# Patient Record
Sex: Male | Born: 1995 | Race: Black or African American | Hispanic: No | Marital: Single | Smoking: Current every day smoker
Health system: Southern US, Community
[De-identification: ages and names within clinical notes are randomized; demographics above are authoritative.]

## PROBLEM LIST (undated history)

## (undated) DIAGNOSIS — K859 Acute pancreatitis without necrosis or infection, unspecified: Secondary | ICD-10-CM

## (undated) HISTORY — PX: SHOULDER SURGERY: SHX246

---
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
## 2013-05-03 ENCOUNTER — Emergency Department (INDEPENDENT_AMBULATORY_CARE_PROVIDER_SITE_OTHER)
Admission: EM | Admit: 2013-05-03 | Discharge: 2013-05-03 | Disposition: A | Discharge status: Home / Self Care | Payer: Self-pay | Source: Home / Self Care

## 2013-05-03 ENCOUNTER — Encounter (HOSPITAL_COMMUNITY): Payer: Self-pay | Admitting: Emergency Medicine

## 2013-05-03 DIAGNOSIS — S40862A Insect bite (nonvenomous) of left upper arm, initial encounter: Secondary | ICD-10-CM

## 2013-05-03 DIAGNOSIS — IMO0001 Reserved for inherently not codable concepts without codable children: Secondary | ICD-10-CM

## 2013-05-03 DIAGNOSIS — L089 Local infection of the skin and subcutaneous tissue, unspecified: Secondary | ICD-10-CM

## 2013-05-03 MED ORDER — CEPHALEXIN 500 MG PO CAPS: 500.0000 mg | ORAL_CAPSULE | Freq: Four times a day (QID) | ORAL | Status: DC

## 2013-05-03 MED ORDER — TRIAMCINOLONE ACETONIDE 0.1 % EX CREA: TOPICAL_CREAM | Freq: Two times a day (BID) | CUTANEOUS | Status: DC

## 2013-05-03 NOTE — ED Notes (Signed)
C/o insect bite on left arm and right leg for a week now.  neosporin used.  Bites does itch.  Pus has came out of the bite.

## 2013-05-03 NOTE — ED Provider Notes (Signed)
Medical screening examination/treatment/procedure(s) were performed by non-physician practitioner and as supervising physician I was immediately available for consultation/collaboration.  Wesson Stith, M.D.  Jenica Costilow C Zackory Pudlo, MD 05/03/13 1752 

## 2013-05-03 NOTE — ED Provider Notes (Signed)
CSN: [id number]     Arrival date & time 05/03/13  1449 History   First MD Initiated Contact with Patient 05/03/13 1528     Chief Complaint  Patient presents with  . Insect Bite   (Consider location/radiation/quality/duration/timing/severity/associated sxs/prior Treatment) HPI Comments: 17 year old male is accompanied by his father with the complaint of insect bites one week old. He is unsure as to what type of insect and/or spider may have bitten him. He complains of left arm itching as well as itching in the right shin. There is minor excoriation and wheals to the left elbow over the lateral epicondyles extending superiorly and inferiorly measuring approximately 8 x 8 cm. The lesion on the right shin is approximately 3 cm across there is evidence that there may have been a ruptured vesicle that now is flat and dry. Patient denies systemic symptoms. He states that the lesions have not worsened over the past 3 days. They are not tender or painful.   History reviewed. No pertinent past medical history. History reviewed. No pertinent past surgical history. History reviewed. No pertinent family history. History  Substance Use Topics  . Smoking status: Not on file  . Smokeless tobacco: Not on file  . Alcohol Use: Not on file    Review of Systems  Constitutional: Negative.   Respiratory: Negative.   Musculoskeletal: Negative.   Skin: Positive for rash.  Neurological: Negative.     Allergies  Review of patient's allergies indicates no known allergies.  Home Medications   Current Outpatient Rx  Name  Route  Sig  Dispense  Refill  . cephALEXin (KEFLEX) 500 MG capsule   Oral   Take 1 capsule (500 mg total) by mouth 4 (four) times daily.   28 capsule   0   . ibuprofen (ADVIL,MOTRIN) 100 MG tablet   Oral   Take 400 mg by mouth every 6 (six) hours as needed.         . triamcinolone cream (KENALOG) 0.1 %   Topical   Apply topically 2 (two) times daily. Apply for 2 weeks. May use  on face   30 g   0    BP 121/79  Pulse 50  Temp(Src) 98.2 F (36.8 C) (Oral)  Resp 18  SpO2 100% Physical Exam  Nursing note and vitals reviewed. Constitutional: He is oriented to person, place, and time. He appears well-nourished. No distress.  Appears generally well and relaxed posturing.  HENT:  Mouth/Throat: Oropharynx is clear and moist. No oropharyngeal exudate.  No intraoral or pharyngeal swelling.  Eyes: Conjunctivae and EOM are normal.  Neck: Normal range of motion. Neck supple.  Cardiovascular: Normal rate, regular rhythm and normal heart sounds.   Pulmonary/Chest: Effort normal and breath sounds normal. No respiratory distress.  Abdominal: Soft. There is no tenderness.  Musculoskeletal: Normal range of motion.  Neurological: He is alert and oriented to person, place, and time.  Skin: Skin is warm and dry.  Lesions as described in history of present illness. They appeared to be in a healing state and there are areas of nondraining excoriation. Minor swelling particularly to the right mid anterior shin. No erythema, this is a very dark skinned individual. Neuromuscular and sensory functions of extremities are normal.  Psychiatric: He has a normal mood and affect.    ED Course  Procedures (including critical care time) Labs Review Labs Reviewed - No data to display Imaging Review No results found.  MDM   1. Insect bite of arm, left, initial  encounter   2. Insect bite of leg, infected, right, initial encounter    There are areas of excoriation but without draining or oozing. This may set up a condition of secondary infection so he is treated with Keflex prophylactically. Triamcinolone cream applied topically. If there is any worsening or signs of infection or increased area of tissue destruction, ulceration, weeping, redness, swelling or other symptoms 6 medical attention promptly. May return to the ER.    Hayden Rasmussen, NP 05/03/13 [phone]

## 2015-08-31 ENCOUNTER — Encounter (HOSPITAL_COMMUNITY): Payer: Self-pay | Admitting: Emergency Medicine

## 2015-08-31 ENCOUNTER — Emergency Department (HOSPITAL_COMMUNITY)
Admission: EM | Admit: 2015-08-31 | Discharge: 2015-08-31 | Disposition: A | Discharge status: Home / Self Care | Payer: Self-pay | Source: Home / Self Care | Attending: Emergency Medicine | Admitting: Emergency Medicine

## 2015-08-31 DIAGNOSIS — K648 Other hemorrhoids: Secondary | ICD-10-CM

## 2015-08-31 DIAGNOSIS — K644 Residual hemorrhoidal skin tags: Secondary | ICD-10-CM

## 2015-08-31 DIAGNOSIS — K5900 Constipation, unspecified: Secondary | ICD-10-CM

## 2015-08-31 MED ORDER — HYDROCORTISONE 2.5 % RE CREA: TOPICAL_CREAM | RECTAL | Status: DC

## 2015-08-31 MED ORDER — POLYETHYLENE GLYCOL 3350 17 GM/SCOOP PO POWD: 17.0000 g | Freq: Two times a day (BID) | ORAL | Status: DC

## 2015-08-31 NOTE — ED Provider Notes (Signed)
CSN: [id number]     Arrival date & time 08/31/15  1406 History   First MD Initiated Contact with Patient 08/31/15 1539     Chief Complaint  Patient presents with  . Hemorrhoids   (Consider location/radiation/quality/duration/timing/severity/associated sxs/prior Treatment) HPI He is a 19 year old man here for evaluation of hemorrhoids. He states he has pain with bowel movements. He has noticed a swollen area on his penis. He does report bright red blood with bowel movements. He reports straining and hard bowel movements. He has a bowel movement every 2-3 days.  History reviewed. No pertinent past medical history. Past Surgical History  Procedure Laterality Date  . Shoulder surgery     No family history on file. Social History  Substance Use Topics  . Smoking status: Never Smoker   . Smokeless tobacco: None  . Alcohol Use: No    Review of Systems As in history of present illness Allergies  Review of patient's allergies indicates no known allergies.  Home Medications   Prior to Admission medications   Medication Sig Start Date End Date Taking? Authorizing Provider  hydrocortisone (ANUSOL-HC) 2.5 % rectal cream Apply rectally 2 times daily 08/31/15   Charm RingsErin J Ziere Docken, MD  ibuprofen (ADVIL,MOTRIN) 100 MG tablet Take 400 mg by mouth every 6 (six) hours as needed.    Historical Provider, MD  polyethylene glycol powder (GLYCOLAX/MIRALAX) powder Take 17 g by mouth 2 (two) times daily. 08/31/15   Charm RingsErin J Deacon Gadbois, MD  triamcinolone cream (KENALOG) 0.1 % Apply topically 2 (two) times daily. Apply for 2 weeks. May use on face 05/03/13   [name], NP   Meds Ordered and Administered this Visit  Medications - No data to display  BP 130/79 mmHg  Pulse 61  Temp(Src) 97.9 F (36.6 C) (Oral)  SpO2 98% No data found.   Physical Exam  Constitutional: He is oriented to person, place, and time. He appears well-developed and well-nourished. No distress.  Cardiovascular: Normal rate.    Pulmonary/Chest: Effort normal.  Genitourinary: Rectal exam shows external hemorrhoid. Rectal exam shows no internal hemorrhoid, no fissure, no mass, no tenderness and anal tone normal.  Neurological: He is alert and oriented to person, place, and time.    ED Course  Procedures (including critical care time)  Labs Review Labs Reviewed - No data to display  Imaging Review No results found.    MDM   1. External hemorrhoid   2. Constipation, unspecified constipation type    He has a nonthrombosed external hemorrhoid. Discussed importance of regular bowel movements without straining. Recommended increased fluid and fiber intake. Instructions given for MiraLAX. Prescription given for Anusol cream to use as needed for hemorrhoid pain. Follow-up as needed.    Charm RingsErin J Brealynn Contino, MD 08/31/15 [phone]

## 2015-08-31 NOTE — Discharge Instructions (Signed)
You have a hemorrhoid. The most important thing is to stop straining with bowel movements. Make sure you are drinking plenty of fluids and getting fiber in your diet. Take MiraLAX twice a day until you are having a daily soft bowel movement. Once you are having a daily soft bowel movement, you can use the MiraLAX as needed. Use the Anusol cream twice a day as needed for hemorrhoid pain. Follow-up with your primary care doctor as needed.

## 2015-10-03 ENCOUNTER — Emergency Department (HOSPITAL_COMMUNITY): Payer: PRIVATE HEALTH INSURANCE

## 2015-10-03 ENCOUNTER — Emergency Department (HOSPITAL_COMMUNITY)
Admission: EM | Admit: 2015-10-03 | Discharge: 2015-10-03 | Disposition: A | Discharge status: Home / Self Care | Payer: PRIVATE HEALTH INSURANCE | Attending: Emergency Medicine | Admitting: Emergency Medicine

## 2015-10-03 ENCOUNTER — Encounter (HOSPITAL_COMMUNITY): Payer: Self-pay

## 2015-10-03 DIAGNOSIS — Y92321 Football field as the place of occurrence of the external cause: Secondary | ICD-10-CM | POA: Insufficient documentation

## 2015-10-03 DIAGNOSIS — Y998 Other external cause status: Secondary | ICD-10-CM | POA: Insufficient documentation

## 2015-10-03 DIAGNOSIS — S43101A Unspecified dislocation of right acromioclavicular joint, initial encounter: Secondary | ICD-10-CM

## 2015-10-03 DIAGNOSIS — W1839XA Other fall on same level, initial encounter: Secondary | ICD-10-CM | POA: Insufficient documentation

## 2015-10-03 DIAGNOSIS — Z79899 Other long term (current) drug therapy: Secondary | ICD-10-CM | POA: Insufficient documentation

## 2015-10-03 DIAGNOSIS — Y9361 Activity, american tackle football: Secondary | ICD-10-CM | POA: Insufficient documentation

## 2015-10-03 DIAGNOSIS — Z7952 Long term (current) use of systemic steroids: Secondary | ICD-10-CM | POA: Insufficient documentation

## 2015-10-03 DIAGNOSIS — S43131A Dislocation of right acromioclavicular joint, greater than 200% displacement, initial encounter: Secondary | ICD-10-CM | POA: Insufficient documentation

## 2015-10-03 MED ORDER — HYDROCODONE-ACETAMINOPHEN 5-325 MG PO TABS: 1.0000 | ORAL_TABLET | Freq: Four times a day (QID) | ORAL | Status: DC | PRN

## 2015-10-03 MED ORDER — IBUPROFEN 800 MG PO TABS: 800.0000 mg | ORAL_TABLET | Freq: Three times a day (TID) | ORAL | Status: DC

## 2015-10-03 MED ORDER — HYDROCODONE-ACETAMINOPHEN 5-325 MG PO TABS
1.0000 | ORAL_TABLET | Freq: Once | ORAL | Status: AC
  Administered 2015-10-03: 1 via ORAL
  Filled 2015-10-03: qty 1

## 2015-10-03 NOTE — ED Notes (Signed)
Pt arrived via POV c/o right shoulder injury from playing football earlier and falling on it.

## 2015-10-03 NOTE — Discharge Instructions (Signed)
Acromioclavicular Separation With Rehab The acromioclavicular joint is the joint between the roof of the shoulder (acromion) and the collarbone (clavicle). It is vulnerable to injury. An acromioclavicular [facility]) separation is a partial or complete tear (sprain), injury, or redness and soreness (inflammation) of the ligaments that cross the acromioclavicular joint and hold it in place. There are two ligaments in this area that are vulnerable to injury, the acromioclavicular ligament and the coracoclavicular ligament. SYMPTOMS   Tenderness and swelling, or a bump on top of the shoulder (at the [facility] joint).  Bruising (contusion) in the area within 48 hours of injury.  Loss of strength or pain when reaching over the head or across the body. CAUSES  AC separation is caused by direct trauma to the joint (falling on your shoulder) or indirect trauma (falling on an outstretched arm). RISK INCREASES WITH:  Sports that require contact or collision, throwing sports (i.e. racquetball, squash).  Poor strength and flexibility.  Previous shoulder sprain or dislocation.  Poorly fitted or padded protective equipment. PREVENTION   Warm-up and stretch properly before activity.  Maintain physical fitness:  Shoulder strength.  Shoulder flexibility.  Cardiovascular fitness.  Wear properly fitted and padded protective equipment.  Learn and use proper technique when playing sports. Have a coach correct improper technique, including falling and landing.  Apply taping, protective strapping or padding, or an adhesive bandage as recommended before practice or competition. PROGNOSIS   If treated properly, the symptoms of AC separation can be expected to go away.  If treated improperly, permanent disability may occur unless surgery is performed.  Healing time varies with type of sport and position, arm injured (dominant versus non-dominant) and severity of sprain. RELATED COMPLICATIONS  Weakness and  fatigue of the arm or shoulder are possible but uncommon.  Pain and inflammation of the [facility] joint may continue.  Prolonged healing time may be necessary if usual activities are resumed too early. This causes a susceptibility to recurrent injury.  Prolonged disability may occur.  The shoulder may remain unstable or arthritic following repeated injury. TREATMENT  Treatment initially involves ice and medication to help reduce pain and inflammation. It may also be necessary to modify your activities in order to prevent further injury. Both non-surgical and surgical interventions exist to treat AC separation. Non-surgical intervention is usually recommended and involves wearing a sling to immobilize the joint for a period of time to allow for healing. Surgical intervention is usually only considered for severe sprains of the ligament or for individuals who do not improve after 2 to 6 months of non-surgical treatment. Surgical interventions require 4 to 6 months before a return to sports is possible. MEDICATION  If pain medication is necessary, nonsteroidal anti-inflammatory medications, such as aspirin and ibuprofen, or other minor pain relievers, such as acetaminophen, are often recommended.  Do not take pain medication for 7 days before surgery.  Prescription pain relievers may be given by your caregiver. Use only as directed and only as much as you need.  Ointments applied to the skin may be helpful.  Corticosteroid injections may be given to reduce inflammation. HEAT AND COLD  Cold treatment (icing) relieves pain and reduces inflammation. Cold treatment should be applied for 10 to 15 minutes every 2 to 3 hours for inflammation and pain and immediately after any activity that aggravates your symptoms. Use ice packs or an ice massage.  Heat treatment may be used prior to performing the stretching and strengthening activities prescribed by your caregiver, physical therapist or  Warehouse manager. Use a heat pack or a warm soak. SEEK IMMEDIATE MEDICAL CARE IF:   Pain, swelling or bruising worsens despite treatment.  There is pain, numbness or coldness in the arm.  Discoloration appears in the fingernails.  New, unexplained symptoms develop. EXERCISES  RANGE OF MOTION (ROM) AND STRETCHING EXERCISES - Acromioclavicular Separation These exercises may help you when beginning to rehabilitate your injury. Your symptoms may resolve with or without further involvement from your physician, physical therapist or athletic trainer. While completing these exercises, remember:  Restoring tissue flexibility helps normal motion to return to the joints. This allows healthier, less painful movement and activity.  An effective stretch should be held for at least 30 seconds.  A stretch should never be painful. You should only feel a gentle lengthening or release in the stretched tissue. ROM - Pendulum  Bend at the waist so that your right / left arm falls away from your body. Support yourself with your opposite hand on a solid surface, such as a table or a countertop.  Your right / left arm should be perpendicular to the ground. If it is not perpendicular, you need to lean over farther. Relax the muscles in your right / left arm and shoulder as much as possible.  Gently sway your hips and trunk so they move your right / left arm without any use of your right / left shoulder muscles.  Progress your movements so that your right / left arm moves side to side, then forward and backward, and finally, both clockwise and counterclockwise.  Complete __________ repetitions in each direction. Many people use this exercise to relieve discomfort in their shoulder as well as to gain range of motion. Repeat __________ times. Complete this exercise __________ times per day. STRETCH - Flexion, Seated   Sit in a firm chair so that your right / left forearm can rest on a table or countertop. Your right /  left elbow should rest below the height of your shoulder so that your shoulder feels supported and not tense or uncomfortable.  Keeping your right / left shoulder relaxed, lean forward at your waist, allowing your right / left hand to slide forward. Bend forward until you feel a moderate stretch in your shoulder, but before you feel an increase in your pain.  Hold __________ seconds. Slowly return to your starting position. Repeat __________ times. Complete this exercise __________ times per day. STRETCH - Flexion, Standing  Stand with good posture. With an underhand grip on your right / left and an overhand grip on the opposite hand, grasp a broomstick or cane so that your hands are a little more than shoulder-width apart.  Keeping your right / left elbow straight and shoulder muscles relaxed, push the stick with your opposite hand to raise your right / left arm in front of your body and then overhead. Raise your arm until you feel a stretch in your right / left shoulder, but before you have increased shoulder pain.  Try to avoid shrugging your right / left shoulder as your arm rises by keeping your shoulder blade tucked down and toward your mid-back spine. Hold __________ seconds.  Slowly return to the starting position. Repeat __________ times. Complete this exercise __________ times per day. STRENGTHENING EXERCISES - Acromioclavicular Separation These exercises may help you when beginning to rehabilitate your injury. They may resolve your symptoms with or without further involvement from your physician, physical therapist or athletic trainer. While completing these exercises, remember:  Muscles  can gain both the endurance and the strength needed for everyday activities through controlled exercises.  Complete these exercises as instructed by your physician, physical therapist or athletic trainer. Progress the resistance and repetitions only as guided.  You may experience muscle soreness or  fatigue, but the pain or discomfort you are trying to eliminate should never worsen during these exercises. If this pain does worsen, stop and make certain you are following the directions exactly. If the pain is still present after adjustments, discontinue the exercise until you can discuss the trouble with your clinician. STRENGTH - Shoulder Abductors, Isometric   With good posture, stand or sit about 4-6 inches from a wall with your right / left side facing the wall.  Bend your right / left elbow. Gently press your right / left elbow into the wall. Increase the pressure gradually until you are pressing as hard as you can without shrugging your shoulder or increasing any shoulder discomfort.  Hold __________ seconds.  Release the tension slowly. Relax your shoulder muscles completely before you start the next repetition. Repeat __________ times. Complete this exercise __________ times per day. STRENGTH - Internal Rotators, Isometric  Keep your right / left elbow at your side and bend it 90 degrees.  Step into a door frame so that the inside of your right / left wrist can press against the door frame without your upper arm leaving your side.  Gently press your right / left wrist into the door frame as if you were trying to draw the palm of your hand to your abdomen. Gradually increase the tension until you are pressing as hard as you can without shrugging your shoulder or increasing any shoulder discomfort.  Hold __________ seconds.  Release the tension slowly. Relax your shoulder muscles completely before you the next repetition. Repeat __________ times. Complete this exercise __________ times per day.  STRENGTH - External Rotators, Isometric  Keep your right / left elbow at your side and bend it 90 degrees.  Step into a door frame so that the outside of your right / left wrist can press against the door frame without your upper arm leaving your side.  Gently press your right / left  wrist into the door frame as if you were trying to swing the back of your hand away from your abdomen. Gradually increase the tension until you are pressing as hard as you can without shrugging your shoulder or increasing any shoulder discomfort.  Hold __________ seconds.  Release the tension slowly. Relax your shoulder muscles completely before you the next repetition. Repeat __________ times. Complete this exercise __________ times per day. STRENGTH - Internal Rotators  Secure a rubber exercise band/tubing to a fixed object so that it is at the same height as your right / left elbow when you are standing or sitting on a firm surface.  Stand or sit so that the secured exercise band/tubing is at your right / left side.  Bend your elbow 90 degrees. Place a folded towel or small pillow under your right / left arm so that your elbow is a few inches away from your side.  Keeping the tension on the exercise band/tubing, pull it across your body toward your abdomen. Be sure to keep your body steady so that the movement is only coming from your shoulder rotating.  Hold __________ seconds. Release the tension in a controlled manner as you return to the starting position. Repeat __________ times. Complete this exercise __________ times per day. STRENGTH -  External Rotators  Secure a rubber exercise band/tubing to a fixed object so that it is at the same height as your right / left elbow when you are standing or sitting on a firm surface.  Stand or sit so that the secured exercise band/tubing is at your side that is not injured.  Bend your elbow 90 degrees. Place a folded towel or small pillow under your right / left arm so that your elbow is a few inches away from your side.  Keeping the tension on the exercise band/tubing, pull it away from your body, as if pivoting on your elbow. Be sure to keep your body steady so that the movement is only coming from your shoulder rotating.  Hold __________  seconds. Release the tension in a controlled manner as you return to the starting position. Repeat __________ times. Complete this exercise __________ times per day.   This information is not intended to replace advice given to you by your health care provider. Make sure you discuss any questions you have with your health care provider.   Document Released: 08/20/2005 Document Revised: 09/10/2014 Document Reviewed: 12/02/2008 Elsevier Interactive Patient Education 2016 [employer]  

## 2015-10-03 NOTE — ED Notes (Signed)
Pt stable, ambulatory, states understanding of discharge instructions 

## 2015-10-03 NOTE — ED Provider Notes (Signed)
CSN: [id number]     Arrival date & time 10/03/15  2103 History  By signing my name below, I, Jeff Banks, attest that this documentation has been prepared under the direction and in the presence of Alveta Heimlich, [name] Electronically Signed: Soijett Banks, ED Scribe. 10/03/2015. 9:46 PM.   Chief Complaint  Patient presents with  . Shoulder Injury   Patient is a 20 y.o. male presenting with shoulder pain. The history is provided by the patient. No language interpreter was used.  Shoulder Pain Location:  Shoulder Time since incident:  3 days Injury: yes   Mechanism of injury: fall   Shoulder location:  R shoulder Pain details:    Quality:  Shooting and sharp   Radiates to:  Does not radiate   Severity:  Moderate   Onset quality:  Sudden   Timing:  Constant Prior injury to area:  No Relieved by:  Being still Worsened by:  Movement Ineffective treatments:  NSAIDs Associated symptoms: decreased range of motion and numbness   Associated symptoms: no muscle weakness, no stiffness, no swelling and no tingling      Jeff Banks is a 20 y.o. male who presents to the Emergency Department complaining of constant sharp right shoulder pain after injury occurring 3 days ago. He notes that he landed directly onto his right shoulder while playing football. Pt right shoulder pain is worsened with movement and there are no alleviating factors. Pt is having associated symptoms of mild numbness in the upper arm. He notes that he has tried advil and aleve with no relief of his symptoms. He denies color change, wound, rash, tingling, weakness, loss of sensation and any other symptoms. Patient has history of left shoulder surgery. He was followed by [facility].   History reviewed. No pertinent past medical history. Past Surgical History  Procedure Laterality Date  . Shoulder surgery     History reviewed. No pertinent family history. Social History  Substance Use Topics  . Smoking status:  Never Smoker   . Smokeless tobacco: None  . Alcohol Use: No    Review of Systems  Musculoskeletal: Positive for arthralgias. Negative for stiffness.  Skin: Negative for color change, rash and wound.  Neurological: Positive for numbness. Negative for weakness.       No tingling      Allergies  Review of patient's allergies indicates no known allergies.  Home Medications   Prior to Admission medications   Medication Sig Start Date End Date Taking? Authorizing Provider  HYDROcodone-acetaminophen (NORCO/VICODIN) 5-325 MG tablet Take 1 tablet by mouth every 6 (six) hours as needed. 10/03/15   Chapman Matteucci, [name]  hydrocortisone (ANUSOL-HC) 2.5 % rectal cream Apply rectally 2 times daily 08/31/15   Charm Rings, MD  ibuprofen (ADVIL,MOTRIN) 100 MG tablet Take 400 mg by mouth every 6 (six) hours as needed.    Historical Provider, MD  ibuprofen (ADVIL,MOTRIN) 800 MG tablet Take 1 tablet (800 mg total) by mouth 3 (three) times daily. 10/03/15   Katilin Raynes, [name]  polyethylene glycol powder (GLYCOLAX/MIRALAX) powder Take 17 g by mouth 2 (two) times daily. 08/31/15   Charm Rings, MD  triamcinolone cream (KENALOG) 0.1 % Apply topically 2 (two) times daily. Apply for 2 weeks. May use on face 05/03/13   Hayden Rasmussen, NP   BP 136/77 mmHg  Pulse 102  Temp(Src) 97.7 F (36.5 C) (Oral)  Resp 28  SpO2 100% Physical Exam  Constitutional: He appears well-developed and well-nourished. No distress.  HENT:  Head: Normocephalic and atraumatic.  Eyes: Conjunctivae are normal. Right eye exhibits no discharge. Left eye exhibits no discharge. No scleral icterus.  Neck: Normal range of motion.  Cardiovascular: Normal rate, regular rhythm and intact distal pulses.   Radial pulse palpable. Cap refill less than 3 seconds.  Pulmonary/Chest: Effort normal. No respiratory distress.  Musculoskeletal:       Right shoulder: He exhibits decreased range of motion, tenderness and deformity. He exhibits no crepitus,  normal pulse and normal strength.       Arms: Prominent distal portion of the clavicle noted. Patient has decreased active range of motion secondary to pain patient can extend arm approximately 80 before pain. Patient resists passive range of motion secondary to pain. No edema or swelling. No skin breakdown over protuberant clavicle.  Neurological: He is alert. Coordination normal.  5/5 bilateral grip and biceps strength. Patient refuses shoulder strength testing secondary to pain. Sensation to light touch intact over all dermatomes.  Skin: Skin is warm and dry. No erythema.  No skin breakdown or erythema noted over right shoulder.  Psychiatric: He has a normal mood and affect. His behavior is normal.  Nursing note and vitals reviewed.    ED Course  Procedures (including critical care time) DIAGNOSTIC STUDIES: Oxygen Saturation is 100% on RA, nl by my interpretation.    COORDINATION OF CARE: 9:44 PM Discussed treatment plan with pt at bedside which includes right shoulder xray and pt agreed to plan.  Labs Review Labs Reviewed - No data to display  Imaging Review Dg Shoulder Right  10/03/2015  CLINICAL DATA:  Football injury 3 days ago. EXAM: RIGHT SHOULDER - 2+ VIEW COMPARISON:  02/20/2012 FINDINGS: No fractures identified. There is widening of the [facility] joint compatible with a grade 3 separation. IMPRESSION: Suspected grade 3 AC joint separation. Electronically Signed   By: Signa Kell M.D.   On: 10/03/2015 22:06   I have personally reviewed and evaluated these images as part of my medical decision-making.   EKG Interpretation None      MDM   Final diagnoses:  Acromioclavicular joint separation, type 3, right, initial encounter   Patient presenting with right shoulder pain after a fall. Right arm is neurovascularly intact with restricted range of motion secondary to pain. Prominent distal clavicle. Patient X-Ray shows probable grade 3 AC joint separation. Pain managed in ED  with Vicodin. Sling given. Discussed RICE therapy and use of OTC pain relievers. Will discharge with short course of Vicodin for pain control. Pt advised to follow up with [facility] tomorrow. Return precautions discussed at bedside and given in discharge paperwork. Pt is stable for discharge.  I personally performed the services described in this documentation, which was scribed in my presence. The recorded information has been reviewed and is accurate.   Rolm Gala Jaikob Borgwardt, [name] 10/03/15 2236  Eber Hong, MD 10/04/15 [phone]

## 2015-12-23 ENCOUNTER — Ambulatory Visit (HOSPITAL_COMMUNITY)
Admission: EM | Admit: 2015-12-23 | Discharge: 2015-12-23 | Disposition: A | Discharge status: Home / Self Care | Payer: PRIVATE HEALTH INSURANCE | Attending: Emergency Medicine | Admitting: Emergency Medicine

## 2015-12-23 ENCOUNTER — Encounter (HOSPITAL_COMMUNITY): Payer: Self-pay | Admitting: Emergency Medicine

## 2015-12-23 DIAGNOSIS — K2970 Gastritis, unspecified, without bleeding: Secondary | ICD-10-CM

## 2015-12-23 MED ORDER — ONDANSETRON HCL 4 MG PO TABS: 4.0000 mg | ORAL_TABLET | Freq: Four times a day (QID) | ORAL | Status: DC

## 2015-12-23 MED ORDER — ONDANSETRON 4 MG PO TBDP
4.0000 mg | ORAL_TABLET | Freq: Once | ORAL | Status: AC
  Administered 2015-12-23: 4 mg via ORAL

## 2015-12-23 MED ORDER — ONDANSETRON 4 MG PO TBDP
ORAL_TABLET | ORAL | Status: AC
  Filled 2015-12-23: qty 1

## 2015-12-23 NOTE — ED Provider Notes (Signed)
CSN: [id number]     Arrival date & time 12/23/15  1740 History   First MD Initiated Contact with Patient 12/23/15 1826     Chief Complaint  Patient presents with  . Fever   (Consider location/radiation/quality/duration/timing/severity/associated sxs/prior Treatment) HPI Feeling feverish with vomiting for today. No temp at home. Has had several episodes of vomiitng. Last episode about 1 hour ago which was sprite. Also mild headache.  History reviewed. No pertinent past medical history. Past Surgical History  Procedure Laterality Date  . Shoulder surgery     No family history on file. Social History  Substance Use Topics  . Smoking status: Never Smoker   . Smokeless tobacco: None  . Alcohol Use: No    Review of Systems Vomiting, fever Allergies  Review of patient's allergies indicates no known allergies.  Home Medications   Prior to Admission medications   Medication Sig Start Date End Date Taking? Authorizing Provider  HYDROcodone-acetaminophen (NORCO/VICODIN) 5-325 MG tablet Take 1 tablet by mouth every 6 (six) hours as needed. 10/03/15   Stevi Barrett, [name]  hydrocortisone (ANUSOL-HC) 2.5 % rectal cream Apply rectally 2 times daily 08/31/15   Charm Rings, MD  ibuprofen (ADVIL,MOTRIN) 100 MG tablet Take 400 mg by mouth every 6 (six) hours as needed.    Historical Provider, MD  ibuprofen (ADVIL,MOTRIN) 800 MG tablet Take 1 tablet (800 mg total) by mouth 3 (three) times daily. 10/03/15   Stevi Barrett, [name]  ondansetron (ZOFRAN) 4 MG tablet Take 1 tablet (4 mg total) by mouth every 6 (six) hours. 12/23/15   Tharon Aquas, PA  polyethylene glycol powder (GLYCOLAX/MIRALAX) powder Take 17 g by mouth 2 (two) times daily. 08/31/15   Charm Rings, MD  triamcinolone cream (KENALOG) 0.1 % Apply topically 2 (two) times daily. Apply for 2 weeks. May use on face 05/03/13   Hayden Rasmussen, NP   Meds Ordered and Administered this Visit   Medications  ondansetron (ZOFRAN-ODT) disintegrating  tablet 4 mg (not administered)    BP 114/70 mmHg  Pulse 84  Temp(Src) 98.7 F (37.1 C) (Oral)  Resp 16  SpO2 100% No data found.   Physical Exam NURSES NOTES AND VITAL SIGNS REVIEWED. CONSTITUTIONAL: Well developed, well nourished, no acute distress, HEENT: normocephalic, atraumatic, right and left TM's are normal, mucosa pink and moist, no exudate or redness posterior pharynx.  EYES: Conjunctiva normal NECK:normal ROM, supple, no adenopathy PULMONARY:No respiratory distress, normal effort, Lungs: CTAb/l, no wheezes, or increased work of breathing CARDIOVASCULAR: RRR, no murmur ABDOMEN: soft, ND, NT, +'ve BS MUSCULOSKELETAL: Normal ROM of all extremities,  SKIN: warm and dry without rash PSYCHIATRIC: Mood and affect, behavior are normal  ED Course  Procedures (including critical care time)  Labs Review Labs Reviewed - No data to display  Imaging Review No results found.   Visual Acuity Review  Right Eye Distance:   Left Eye Distance:   Bilateral Distance:    Right Eye Near:   Left Eye Near:    Bilateral Near:      rx zofran   MDM   1. Gastritis     Patient is reassured that there are no issues that require transfer to higher level of care at this time or additional tests. Patient is advised to continue home symptomatic treatment. Patient is advised that if there are new or worsening symptoms to attend the emergency department, contact primary care provider, or return to UC. Instructions of care provided discharged home in stable condition.  THIS NOTE WAS GENERATED USING A VOICE RECOGNITION SOFTWARE PROGRAM. ALL REASONABLE EFFORTS  WERE MADE TO PROOFREAD THIS DOCUMENT FOR ACCURACY.  I have verbally reviewed the discharge instructions with the patient. A printed AVS was given to the patient.  All questions were answered prior to discharge.      Tharon AquasFrank C Trevone Prestwood, PA 12/23/15 1900

## 2015-12-23 NOTE — Discharge Instructions (Signed)

## 2015-12-23 NOTE — ED Notes (Signed)
C/o fevers, vomiting, and HA onset yest.... Taking OTC ibup w/temp relief.  A&O x4... No acute distress.

## 2016-01-26 ENCOUNTER — Ambulatory Visit (HOSPITAL_COMMUNITY)
Admission: EM | Admit: 2016-01-26 | Discharge: 2016-01-26 | Disposition: A | Discharge status: Home / Self Care | Payer: Managed Care, Other (non HMO) | Attending: Family Medicine | Admitting: Family Medicine

## 2016-01-26 ENCOUNTER — Encounter (HOSPITAL_COMMUNITY): Payer: Self-pay

## 2016-01-26 DIAGNOSIS — R51 Headache: Secondary | ICD-10-CM

## 2016-01-26 DIAGNOSIS — R519 Headache, unspecified: Secondary | ICD-10-CM

## 2016-01-26 MED ORDER — DIPHENHYDRAMINE HCL 50 MG/ML IJ SOLN
25.0000 mg | Freq: Once | INTRAMUSCULAR | Status: AC
  Administered 2016-01-26: 25 mg via INTRAMUSCULAR

## 2016-01-26 MED ORDER — DEXAMETHASONE SODIUM PHOSPHATE 10 MG/ML IJ SOLN
10.0000 mg | Freq: Once | INTRAMUSCULAR | Status: AC
  Administered 2016-01-26: 10 mg via INTRAMUSCULAR

## 2016-01-26 MED ORDER — DEXAMETHASONE SODIUM PHOSPHATE 10 MG/ML IJ SOLN
INTRAMUSCULAR | Status: AC
  Filled 2016-01-26: qty 1

## 2016-01-26 MED ORDER — DIPHENHYDRAMINE HCL 25 MG PO CAPS
ORAL_CAPSULE | ORAL | Status: AC
  Filled 2016-01-26: qty 1

## 2016-01-26 MED ORDER — KETOROLAC TROMETHAMINE 60 MG/2ML IM SOLN
INTRAMUSCULAR | Status: AC
  Filled 2016-01-26: qty 2

## 2016-01-26 MED ORDER — KETOROLAC TROMETHAMINE 60 MG/2ML IM SOLN
60.0000 mg | Freq: Once | INTRAMUSCULAR | Status: AC
  Administered 2016-01-26: 60 mg via INTRAMUSCULAR

## 2016-01-26 NOTE — ED Provider Notes (Signed)
CSN: [id number]     Arrival date & time 01/26/16  1914 History   First MD Initiated Contact with Patient 01/26/16 1959     Chief Complaint  Patient presents with  . Migraine   (Consider location/radiation/quality/duration/timing/severity/associated sxs/prior Treatment) HPI History obtained from patient:  Pt presents with the cc of: headache Duration of symptoms:  4 days Treatment prior to arrival: OTC meds without relief of symptoms Context: slow onset about 3 days ago. Other symptoms include: Some nausea and photophobia Pain score: 6 FAMILY HISTORY: History of migraines and females in his family    History reviewed. No pertinent past medical history. Past Surgical History  Procedure Laterality Date  . Shoulder surgery     History reviewed. No pertinent family history. Social History  Substance Use Topics  . Smoking status: Never Smoker   . Smokeless tobacco: None  . Alcohol Use: No    Review of Systems  Denies:  NAUSEA, ABDOMINAL PAIN, CHEST PAIN, CONGESTION, DYSURIA, SHORTNESS OF BREATH  Allergies  Review of patient's allergies indicates no known allergies.  Home Medications   Prior to Admission medications   Medication Sig Start Date End Date Taking? Authorizing Provider  ibuprofen (ADVIL,MOTRIN) 100 MG tablet Take 400 mg by mouth every 6 (six) hours as needed.   Yes Historical Provider, MD  ibuprofen (ADVIL,MOTRIN) 800 MG tablet Take 1 tablet (800 mg total) by mouth 3 (three) times daily. 10/03/15  Yes Stevi Barrett, [name]  HYDROcodone-acetaminophen (NORCO/VICODIN) 5-325 MG tablet Take 1 tablet by mouth every 6 (six) hours as needed. 10/03/15   Stevi Barrett, [name]  hydrocortisone (ANUSOL-HC) 2.5 % rectal cream Apply rectally 2 times daily 08/31/15   [name], MD  ondansetron (ZOFRAN) 4 MG tablet Take 1 tablet (4 mg total) by mouth every 6 (six) hours. 12/23/15   Tharon AquasFrank C Robynn Marcel, PA  polyethylene glycol powder (GLYCOLAX/MIRALAX) powder Take 17 g by mouth 2 (two)  times daily. 08/31/15   [name], MD  triamcinolone cream (KENALOG) 0.1 % Apply topically 2 (two) times daily. Apply for 2 weeks. May use on face 05/03/13   [name], NP   Meds Ordered and Administered this Visit   Medications  diphenhydrAMINE (BENADRYL) injection 25 mg (25 mg Intramuscular Given 01/26/16 2016)  dexamethasone (DECADRON) injection 10 mg (10 mg Intramuscular Given 01/26/16 2017)  ketorolac (TORADOL) injection 60 mg (60 mg Intramuscular Given 01/26/16 2017)  symptoms are much better after injection pain score now 3  BP 123/74 mmHg  Pulse 60  Temp(Src) 97.5 F (36.4 C) (Oral)  Resp 16  SpO2 100% No data found.   Physical Exam NURSES NOTES AND VITAL SIGNS REVIEWED. CONSTITUTIONAL: Well developed, well nourished, no acute distress HEENT: normocephalic, atraumatic EYES: Conjunctiva normal NECK:normal ROM, supple, no adenopathy PULMONARY:No respiratory distress, normal effort ABDOMINAL: Soft, ND, NT BS+, No CVAT MUSCULOSKELETAL: Normal ROM of all extremities,  SKIN: warm and dry without rash PSYCHIATRIC: Mood and affect, behavior are normal NEUROLOGICAL SCREENING EXAM: Constitutional: He is oriented to person, place, and time.  Neurological: He is alert and oriented to person, place, and time. He has normal strength and normal reflexes. No cranial nerve deficit or sensory deficit. He displays a negative Romberg sign. GCS eye subscore is 4. GCS verbal subscore is 5. GCS motor subscore is 6.     ED Course  Procedures (including critical care time)  Labs Review Labs Reviewed - No data to display  Imaging Review No results found.   Visual Acuity Review  Right Eye Distance:   Left Eye Distance:   Bilateral Distance:    Right Eye Near:   Left Eye Near:    Bilateral Near:      Return to work note is provided.   MDM   1. Headache, unspecified headache type     Patient is reassured that there are no issues that require transfer to higher level of care  at this time or additional tests. Patient is advised to continue home symptomatic treatment. Patient is advised that if there are new or worsening symptoms to attend the emergency department, contact primary care provider, or return to UC. Instructions of care provided discharged home in stable condition.    THIS NOTE WAS GENERATED USING A VOICE RECOGNITION SOFTWARE PROGRAM. ALL REASONABLE EFFORTS  WERE MADE TO PROOFREAD THIS DOCUMENT FOR ACCURACY.  I have verbally reviewed the discharge instructions with the patient. A printed AVS was given to the patient.  All questions were answered prior to discharge.      Tharon Aquas, PA 01/26/16 2040

## 2016-01-26 NOTE — ED Notes (Signed)
Pt has had migraine for 3-4 days which is not getting any better Pt stated that he was sensitive to lights and sounds

## 2016-01-26 NOTE — Discharge Instructions (Signed)

## 2016-04-19 ENCOUNTER — Encounter (HOSPITAL_COMMUNITY): Payer: Self-pay | Admitting: Emergency Medicine

## 2016-04-19 ENCOUNTER — Emergency Department (HOSPITAL_COMMUNITY)
Admission: EM | Admit: 2016-04-19 | Discharge: 2016-04-19 | Disposition: A | Discharge status: Home / Self Care | Payer: Managed Care, Other (non HMO) | Attending: Emergency Medicine | Admitting: Emergency Medicine

## 2016-04-19 ENCOUNTER — Emergency Department (HOSPITAL_COMMUNITY): Payer: Managed Care, Other (non HMO)

## 2016-04-19 DIAGNOSIS — S42454A Nondisplaced fracture of lateral condyle of right humerus, initial encounter for closed fracture: Secondary | ICD-10-CM | POA: Diagnosis not present

## 2016-04-19 DIAGNOSIS — S42411A Displaced simple supracondylar fracture without intercondylar fracture of right humerus, initial encounter for closed fracture: Secondary | ICD-10-CM

## 2016-04-19 DIAGNOSIS — Y999 Unspecified external cause status: Secondary | ICD-10-CM | POA: Diagnosis not present

## 2016-04-19 DIAGNOSIS — Y939 Activity, unspecified: Secondary | ICD-10-CM | POA: Insufficient documentation

## 2016-04-19 DIAGNOSIS — Y929 Unspecified place or not applicable: Secondary | ICD-10-CM | POA: Diagnosis not present

## 2016-04-19 DIAGNOSIS — S51011A Laceration without foreign body of right elbow, initial encounter: Secondary | ICD-10-CM | POA: Insufficient documentation

## 2016-04-19 DIAGNOSIS — S4991XA Unspecified injury of right shoulder and upper arm, initial encounter: Secondary | ICD-10-CM | POA: Diagnosis present

## 2016-04-19 LAB — BASIC METABOLIC PANEL
Anion gap: 8 (ref 5–15)
BUN: 9 mg/dL (ref 6–20)
CHLORIDE: 102 mmol/L (ref 101–111)
CO2: 29 mmol/L (ref 22–32)
Calcium: 9.3 mg/dL (ref 8.9–10.3)
Creatinine, Ser: 1.11 mg/dL (ref 0.61–1.24)
GFR calc non Af Amer: >60 mL/min (ref >60)
Glucose, Bld: 99 mg/dL (ref 65–99)
POTASSIUM: 3.6 mmol/L (ref 3.5–5.1)
SODIUM: 139 mmol/L (ref 135–145)

## 2016-04-19 LAB — CBC WITH DIFFERENTIAL/PLATELET
BASOS PCT: 2 %
Basophils Absolute: 0.2 10*3/uL — ABNORMAL HIGH (ref 0.0–0.1)
EOS ABS: 0.2 10*3/uL (ref 0.0–0.7)
Eosinophils Relative: 2 %
HEMATOCRIT: 45.3 % (ref 39.0–52.0)
HEMOGLOBIN: 14.7 g/dL (ref 13.0–17.0)
LYMPHS ABS: 4.5 10*3/uL — AB (ref 0.7–4.0)
Lymphocytes Relative: 52 %
MCH: 28 pg (ref 26.0–34.0)
MCHC: 32.5 g/dL (ref 30.0–36.0)
MCV: 86.3 fL (ref 78.0–100.0)
MONOS PCT: 6 %
Monocytes Absolute: 0.5 10*3/uL (ref 0.1–1.0)
NEUTROS ABS: 3.3 10*3/uL (ref 1.7–7.7)
NEUTROS PCT: 38 %
Platelets: 333 10*3/uL (ref 150–400)
RBC: 5.25 MIL/uL (ref 4.22–5.81)
RDW: 14.4 % (ref 11.5–15.5)
WBC: 8.7 10*3/uL (ref 4.0–10.5)

## 2016-04-19 MED ORDER — ONDANSETRON 4 MG PO TBDP
4.0000 mg | ORAL_TABLET | Freq: Once | ORAL | Status: AC
  Administered 2016-04-19: 4 mg via ORAL
  Filled 2016-04-19: qty 1

## 2016-04-19 MED ORDER — BACITRACIN ZINC 500 UNIT/GM EX OINT
TOPICAL_OINTMENT | Freq: Once | CUTANEOUS | Status: AC
  Administered 2016-04-19: 1 via TOPICAL
  Filled 2016-04-19: qty 0.9

## 2016-04-19 MED ORDER — OXYCODONE-ACETAMINOPHEN 5-325 MG PO TABS: 1.0000 | ORAL_TABLET | Freq: Four times a day (QID) | ORAL | 0 refills | Status: DC | PRN

## 2016-04-19 MED ORDER — LIDOCAINE-EPINEPHRINE (PF) 2 %-1:200000 IJ SOLN
10.0000 mL | Freq: Once | INTRAMUSCULAR | Status: AC
  Administered 2016-04-19: 10 mL via INTRADERMAL
  Filled 2016-04-19: qty 20

## 2016-04-19 MED ORDER — DOCUSATE SODIUM 100 MG PO CAPS: 100.0000 mg | ORAL_CAPSULE | Freq: Two times a day (BID) | ORAL | 0 refills | Status: DC

## 2016-04-19 MED ORDER — MORPHINE SULFATE (PF) 4 MG/ML IV SOLN
4.0000 mg | Freq: Once | INTRAVENOUS | Status: AC
  Administered 2016-04-19: 4 mg via INTRAMUSCULAR
  Filled 2016-04-19: qty 1
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
## 2016-04-19 NOTE — ED Notes (Signed)
Dressing applied to pt's right elbow of telfa and kerlix per order.  Pt tolerated dressing without complaint.

## 2016-04-19 NOTE — ED Provider Notes (Signed)
LACERATION REPAIR Performed by: Garlon HatchetSANDERS, Numa Schroeter M Authorized by: Garlon HatchetSANDERS, Angline Schweigert M Consent: Verbal consent obtained. Risks and benefits: risks, benefits and alternatives were discussed Consent given by: patient Patient identity confirmed: provided demographic data Prepped and Draped in normal sterile fashion Wound explored  Laceration Location: right elbow  Laceration Length: 2 cm  No Foreign Bodies seen or palpated  Anesthesia: local infiltration  Local anesthetic: lidocaine 2% with epinephrine  Anesthetic total: 3 ml  Irrigation method: syringe Amount of cleaning: standard  Skin closure: 4-0 prolene  Number of sutures: 2  Technique: simple interrupted  Patient tolerance: Patient tolerated the procedure well with no immediate complications.    Garlon HatchetLisa M Vaniyah Lansky, [name] 04/19/16 [phone]
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
## 2016-04-19 NOTE — Progress Notes (Signed)
Orthopedic Tech Progress Note [facility] 1996/01/07 [id number]  Ortho Devices Type of Ortho Device: Arm sling, Post (long arm) splint Ortho Device/Splint Location: rue Ortho Device/Splint Interventions: Ordered, Application   Jeff Banks, Jeff Banks 04/19/2016, 3:32 AM

## 2016-04-19 NOTE — ED Notes (Signed)
Pt's family approached undersigned in the hallway to demand pain medications for pt at this time.  MD notified.

## 2016-04-19 NOTE — ED Notes (Signed)
Pt provided with d/c instructions at this time. Pt verbalizes understanding of d/c instructions as well as follow up procedure after d/c.  Pt provided with RX for percocet and colace at this time. Pt verbalizes understanding of RX directions at this time.  Pt in no apparent distress at this time. Pt ambulatory at time of d/c.
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
## 2016-05-07 ENCOUNTER — Emergency Department (HOSPITAL_COMMUNITY)
Admission: EM | Admit: 2016-05-07 | Discharge: 2016-05-08 | Disposition: A | Discharge status: Home / Self Care | Payer: Managed Care, Other (non HMO) | Attending: Emergency Medicine | Admitting: Emergency Medicine

## 2016-05-07 ENCOUNTER — Emergency Department (HOSPITAL_COMMUNITY): Payer: Managed Care, Other (non HMO)

## 2016-05-07 ENCOUNTER — Encounter (HOSPITAL_COMMUNITY): Payer: Self-pay | Admitting: Emergency Medicine

## 2016-05-07 ENCOUNTER — Emergency Department (HOSPITAL_COMMUNITY)
Admission: EM | Admit: 2016-05-07 | Discharge: 2016-05-07 | Disposition: A | Discharge status: Home / Self Care | Payer: Managed Care, Other (non HMO) | Source: Home / Self Care | Attending: Emergency Medicine | Admitting: Emergency Medicine

## 2016-05-07 ENCOUNTER — Encounter (HOSPITAL_COMMUNITY): Payer: Self-pay

## 2016-05-07 DIAGNOSIS — M25521 Pain in right elbow: Secondary | ICD-10-CM | POA: Diagnosis not present

## 2016-05-07 DIAGNOSIS — Z79899 Other long term (current) drug therapy: Secondary | ICD-10-CM

## 2016-05-07 DIAGNOSIS — W230XXA Caught, crushed, jammed, or pinched between moving objects, initial encounter: Secondary | ICD-10-CM | POA: Insufficient documentation

## 2016-05-07 DIAGNOSIS — Y929 Unspecified place or not applicable: Secondary | ICD-10-CM | POA: Diagnosis not present

## 2016-05-07 DIAGNOSIS — Y999 Unspecified external cause status: Secondary | ICD-10-CM | POA: Insufficient documentation

## 2016-05-07 DIAGNOSIS — M79601 Pain in right arm: Secondary | ICD-10-CM | POA: Insufficient documentation

## 2016-05-07 DIAGNOSIS — Y939 Activity, unspecified: Secondary | ICD-10-CM | POA: Insufficient documentation

## 2016-05-07 DIAGNOSIS — Y92810 Car as the place of occurrence of the external cause: Secondary | ICD-10-CM

## 2016-05-07 DIAGNOSIS — M79603 Pain in arm, unspecified: Secondary | ICD-10-CM

## 2016-05-07 DIAGNOSIS — S42301S Unspecified fracture of shaft of humerus, right arm, sequela: Secondary | ICD-10-CM

## 2016-05-07 MED ORDER — DICLOFENAC SODIUM 50 MG PO TBEC: 50.0000 mg | DELAYED_RELEASE_TABLET | Freq: Two times a day (BID) | ORAL | 0 refills | Status: DC

## 2016-05-07 MED ORDER — TRAMADOL HCL 50 MG PO TABS
50.0000 mg | ORAL_TABLET | Freq: Four times a day (QID) | ORAL | 0 refills | Status: DC | PRN
Start: 2016-05-07 — End: 2016-05-09

## 2016-05-07 MED ORDER — OXYCODONE-ACETAMINOPHEN 5-325 MG PO TABS
1.0000 | ORAL_TABLET | Freq: Once | ORAL | Status: AC
  Administered 2016-05-07: 1 via ORAL
  Filled 2016-05-07: qty 1
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
## 2016-05-07 NOTE — ED Notes (Signed)
Declined W/C at D/C and was escorted to lobby by RN. 

## 2016-05-07 NOTE — ED Triage Notes (Signed)
Sutures removed from RT arm.

## 2016-05-07 NOTE — ED Triage Notes (Signed)
Pt states that he was seen he earlier today for r elbow pain and pain has gotten worse since leaving. Positive PMS.

## 2016-05-07 NOTE — Discharge Instructions (Signed)
Follow up with Dr. Thurston HoleWainer as scheduled. Return here as needed.

## 2016-05-07 NOTE — Progress Notes (Signed)
Orthopedic Tech Progress Note Patient Details:  Shelby MattocksYuau Ashworth May 02, 1996 [id number]  Ortho Devices Type of Ortho Device: Ace wrap, Long arm splint Ortho Device/Splint Interventions: Application   Saul FordyceJennifer C Matai Carpenito 05/07/2016, 5:17 PM
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
## 2016-05-08 MED ORDER — HYDROCODONE-ACETAMINOPHEN 5-325 MG PO TABS: 2.0000 | ORAL_TABLET | ORAL | 0 refills | Status: DC | PRN

## 2016-05-08 MED ORDER — OXYCODONE-ACETAMINOPHEN 5-325 MG PO TABS
1.0000 | ORAL_TABLET | Freq: Once | ORAL | Status: AC
  Administered 2016-05-08: 1 via ORAL
  Filled 2016-05-08: qty 1

## 2016-05-08 NOTE — ED Notes (Signed)
Ice applied and arm placed back in sling

## 2016-05-08 NOTE — Discharge Instructions (Signed)
Do not drive while taking the narcotic
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
## 2016-05-09 ENCOUNTER — Encounter (HOSPITAL_COMMUNITY): Payer: Self-pay | Admitting: Emergency Medicine

## 2016-05-09 ENCOUNTER — Emergency Department (HOSPITAL_COMMUNITY)
Admission: EM | Admit: 2016-05-09 | Discharge: 2016-05-09 | Disposition: A | Discharge status: Home / Self Care | Payer: Managed Care, Other (non HMO) | Source: Home / Self Care | Attending: Emergency Medicine | Admitting: Emergency Medicine

## 2016-05-09 ENCOUNTER — Emergency Department (HOSPITAL_COMMUNITY)
Admission: EM | Admit: 2016-05-09 | Discharge: 2016-05-09 | Disposition: A | Discharge status: Home / Self Care | Payer: Managed Care, Other (non HMO) | Attending: Physician Assistant | Admitting: Physician Assistant

## 2016-05-09 DIAGNOSIS — Y999 Unspecified external cause status: Secondary | ICD-10-CM | POA: Insufficient documentation

## 2016-05-09 DIAGNOSIS — S42402D Unspecified fracture of lower end of left humerus, subsequent encounter for fracture with routine healing: Secondary | ICD-10-CM

## 2016-05-09 DIAGNOSIS — Z79899 Other long term (current) drug therapy: Secondary | ICD-10-CM | POA: Insufficient documentation

## 2016-05-09 DIAGNOSIS — Y929 Unspecified place or not applicable: Secondary | ICD-10-CM

## 2016-05-09 DIAGNOSIS — Y939 Activity, unspecified: Secondary | ICD-10-CM

## 2016-05-09 DIAGNOSIS — S42401D Unspecified fracture of lower end of right humerus, subsequent encounter for fracture with routine healing: Secondary | ICD-10-CM

## 2016-05-09 DIAGNOSIS — M79601 Pain in right arm: Secondary | ICD-10-CM | POA: Diagnosis not present

## 2016-05-09 MED ORDER — MORPHINE SULFATE 15 MG PO TABS
15.0000 mg | ORAL_TABLET | Freq: Four times a day (QID) | ORAL | 0 refills | Status: DC | PRN
Start: 2016-05-09 — End: 2016-07-06

## 2016-05-09 MED ORDER — NAPROXEN 500 MG PO TABS: 500.0000 mg | ORAL_TABLET | Freq: Two times a day (BID) | ORAL | 0 refills | Status: DC

## 2016-05-09 NOTE — ED Triage Notes (Signed)
Pt here for right arm pain; pt seen here for same x 2; pt sts unable to pay ortho co pay

## 2016-05-09 NOTE — ED Notes (Signed)
PT DISCHARGED. INSTRUCTIONS AND PRESCRIPTIONS GIVEN. AAOX4. PT IN NO APPARENT DISTRESS. THE OPPORTUNITY TO ASK QUESTIONS WAS PROVIDED. 
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
## 2016-06-11 ENCOUNTER — Emergency Department (HOSPITAL_COMMUNITY)
Admission: EM | Admit: 2016-06-11 | Discharge: 2016-06-12 | Disposition: A | Discharge status: Home / Self Care | Payer: Managed Care, Other (non HMO) | Attending: Emergency Medicine | Admitting: Emergency Medicine

## 2016-06-11 ENCOUNTER — Encounter (HOSPITAL_COMMUNITY): Payer: Self-pay | Admitting: Emergency Medicine

## 2016-06-11 ENCOUNTER — Emergency Department (HOSPITAL_COMMUNITY): Payer: Managed Care, Other (non HMO)

## 2016-06-11 DIAGNOSIS — S4991XA Unspecified injury of right shoulder and upper arm, initial encounter: Secondary | ICD-10-CM | POA: Diagnosis present

## 2016-06-11 DIAGNOSIS — Y9361 Activity, american tackle football: Secondary | ICD-10-CM | POA: Diagnosis not present

## 2016-06-11 DIAGNOSIS — Y929 Unspecified place or not applicable: Secondary | ICD-10-CM | POA: Diagnosis not present

## 2016-06-11 DIAGNOSIS — S43101A Unspecified dislocation of right acromioclavicular joint, initial encounter: Secondary | ICD-10-CM | POA: Diagnosis not present

## 2016-06-11 DIAGNOSIS — Y999 Unspecified external cause status: Secondary | ICD-10-CM | POA: Diagnosis not present

## 2016-06-11 DIAGNOSIS — W500XXA Accidental hit or strike by another person, initial encounter: Secondary | ICD-10-CM | POA: Insufficient documentation

## 2016-06-11 DIAGNOSIS — M25511 Pain in right shoulder: Secondary | ICD-10-CM

## 2016-06-11 DIAGNOSIS — S43006A Unspecified dislocation of unspecified shoulder joint, initial encounter: Secondary | ICD-10-CM

## 2016-06-11 NOTE — ED Provider Notes (Signed)
[facility]-EMERGENCY DEPT Provider Note   CSN: [id number] Arrival date & time: 06/11/16  2221   By signing my name below, I, Jeff Banks, attest that this documentation has been prepared under the direction and in the presence of Jeff Banks Cathren Sween, MD  Electronically Signed: Clovis PuAvnee Banks, ED Scribe. 06/11/16. 12:10 AM.   History   Chief Complaint Chief Complaint  Patient presents with  . Shoulder Injury    dislocation    The history is provided by the patient. No language interpreter was used.   HPI Comments:  Jeff Banks is a 20 y.o. male, with a hx of chronic pain, shoulder dislocation and a PSHx of shoulder surgery, who presents to the Emergency Department complaining of sudden onset R shoulder injury s/p an incident which occurred 30 minutes prior to arrival. Pt states he was playing football and notes someone ran into his shoulder. He denies any illnesses. No alleviating factors noted. Pt denies any other complaints at this time. Pt has a follwow up appointment with his iorthopedic doctor next thurs/tues  History reviewed. No pertinent past medical history.  There are no active problems to display for this patient.   Past Surgical History:  Procedure Laterality Date  . SHOULDER SURGERY       Home Medications    Prior to Admission medications   Medication Sig Start Date End Date Taking? Authorizing Provider  docusate sodium (COLACE) 100 MG capsule Take 1 capsule (100 mg total) by mouth every 12 (twelve) hours. Patient not taking: Reported on 06/12/2016 04/19/16   [name], DO  morphine (MSIR) 15 MG tablet Take 1 tablet (15 mg total) by mouth every 6 (six) hours as needed for severe pain. Patient not taking: Reported on 06/12/2016 05/09/16   [name], [name]  naproxen (NAPROSYN) 500 MG tablet Take 1 tablet (500 mg total) by mouth 2 (two) times daily. 06/12/16   Jeff Banks Jeff Meisenheimer, MD  ondansetron (ZOFRAN) 4 MG tablet Take 1 tablet (4 mg total) by mouth every 6 (six)  hours. Patient not taking: Reported on 06/12/2016 12/23/15   [name], PA  polyethylene glycol powder (GLYCOLAX/MIRALAX) powder Take 17 g by mouth 2 (two) times daily. Patient not taking: Reported on 06/12/2016 08/31/15   [name], MD  triamcinolone cream (KENALOG) 0.1 % Apply topically 2 (two) times daily. Apply for 2 weeks. May use on face Patient not taking: Reported on 06/12/2016 05/03/13   [name], NP    Family History No family history on file.  Social History Social History  Substance Use Topics  . Smoking status: Never Smoker  . Smokeless tobacco: Never Used  . Alcohol use No     Allergies   Review of patient's allergies indicates no known allergies.   Review of Systems Review of Systems  Musculoskeletal: Positive for arthralgias.  Neurological: Positive for numbness.  10 Systems reviewed and are negative for acute change except as noted in the HPI.  Physical Exam Updated Vital Signs BP 119/82   Pulse (!) 55   Temp 98.7 F (37.1 C) (Oral)   Resp 18   SpO2 100%   Physical Exam  Constitutional: He is oriented to person, place, and time. He appears well-developed and well-nourished. No distress.  HENT:  Head: Normocephalic and atraumatic.  Right Ear: Hearing normal.  Left Ear: Hearing normal.  Nose: Nose normal.  Mouth/Throat: Oropharynx is clear and moist and mucous membranes are normal.  Eyes: Conjunctivae and EOM are normal. Pupils are equal, round, and  reactive to light.  Neck: Normal range of motion. Neck supple.  Cardiovascular: Regular rhythm, S1 normal and S2 normal.  Exam reveals no gallop and no friction rub.   No murmur heard. Pulmonary/Chest: Effort normal and breath sounds normal. No respiratory distress. He exhibits no tenderness.  Abdominal: Soft. Normal appearance and bowel sounds are normal. There is no hepatosplenomegaly. There is no tenderness. There is no rebound, no guarding, no tenderness at McBurney's point and negative  Murphy's sign. No hernia.  Musculoskeletal:   Pt has some numbness over R deltoid region.  + deformity around the [facility] area - just indentation. Passive ROM over the shoulder is tender, but I am able to externally and internally rotate the shoulder.    Neurological: He is alert and oriented to person, place, and time. Coordination normal. GCS eye subscore is 4. GCS verbal subscore is 5. GCS motor subscore is 6.  Skin: Skin is warm, dry and intact. No rash noted. No cyanosis.  2 + Radial pulse bilaterally.  Psychiatric: He has a normal mood and affect. His speech is normal and behavior is normal. Thought content normal.  Nursing note and vitals reviewed.    ED Treatments / Results  DIAGNOSTIC STUDIES:  Oxygen Saturation is 96% on RA, normal by my interpretation.    COORDINATION OF CARE:  12:09 AM Discussed treatment plan with pt at bedside and pt agreed to plan.  Labs (all labs ordered are listed, but only abnormal results are displayed) Labs Reviewed - No data to display  EKG  EKG Interpretation None       Radiology Dg Shoulder 1v Left  Result Date: 06/12/2016 CLINICAL DATA:  Comparison with right shoulder, given chronic right-sided acromioclavicular joint injury. Initial encounter. EXAM: LEFT SHOULDER - 1 VIEW COMPARISON:  None. FINDINGS: There is no evidence of fracture or dislocation. The left humeral head is seated within the glenoid fossa. The acromioclavicular joint is unremarkable in appearance. No significant soft tissue abnormalities are seen. The visualized portions of the left lung are clear. 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 Electronically Signed   By: Roanna Raider M.D.   On: 06/12/2016 01:32    Procedures Procedures (including critical care time)  Medications Ordered in ED Medications  ibuprofen (ADVIL,MOTRIN) tablet 600 mg (600 mg Oral  Given 06/12/16 0022)  oxyCODONE-acetaminophen (PERCOCET/ROXICET) 5-325 MG per tablet 1 tablet (1 tablet Oral Given 06/12/16 0022)     Initial Impression / Assessment and Plan / ED Course  I have reviewed the triage vital signs and the nursing notes.  Pertinent labs & imaging results that were available during my care of the patient were reviewed by me and considered in my medical decision making (see chart for details).  Clinical Course   PT comes in with shoulder pain. It seems like he had a fracture towards end of October. Pt has had multiple 4 visits between 9/4 and 9/6 for arm pain.  Neurovascularly intact. 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  Pt's pain appears out of proportion, also he reports that he doesn't see a PCP or anyone of pain control. Controlled substance database reviewed:  Pt's reports show  - OXY 10 mg x 120 tables every month from Dr. Ronne Binning - Pt also has morphine 15 mg IR given from ER - Pt also has 20 x  Hydrocodone 5 mg and 25 x Oxycodone 5 mg from the  ER in August.  NO PAIN MEDS WILL BE GIVEN AT THE TIME OF DISCHARGE.  PT reports that he has an appt with Ortho on Tues. Sling/NSAIDs/Ice.      Final Clinical Impressions(s) / ED Diagnoses   Final diagnoses:  Dislocation closed, shoulder  Right shoulder pain  AC separation, right, initial encounter    New Prescriptions Discharge Medication List as of 06/12/2016  2:04 AM        Jeff Kaplan, MD 06/13/16 2337

## 2016-06-11 NOTE — ED Triage Notes (Signed)
Pt arrives with obvious shoulder dislocation to R shoulder, states hx of same. States was playing some sort of sport and ran into something, causing dislocation. CMS intact, able to move fingers.

## 2016-06-12 ENCOUNTER — Emergency Department (HOSPITAL_COMMUNITY): Payer: Managed Care, Other (non HMO)

## 2016-06-12 MED ORDER — NAPROXEN 500 MG PO TABS: 500.0000 mg | ORAL_TABLET | Freq: Two times a day (BID) | ORAL | 0 refills | Status: DC

## 2016-06-12 MED ORDER — OXYCODONE-ACETAMINOPHEN 5-325 MG PO TABS
1.0000 | ORAL_TABLET | Freq: Once | ORAL | Status: AC
  Administered 2016-06-12: 1 via ORAL
  Filled 2016-06-12: qty 1

## 2016-06-12 MED ORDER — IBUPROFEN 200 MG PO TABS
600.0000 mg | ORAL_TABLET | Freq: Once | ORAL | Status: AC
  Administered 2016-06-12: 600 mg via ORAL
  Filled 2016-06-12: qty 3

## 2016-06-12 NOTE — ED Notes (Signed)
Patient transported to X-ray 

## 2016-06-14 ENCOUNTER — Emergency Department (HOSPITAL_COMMUNITY): Payer: Managed Care, Other (non HMO)

## 2016-06-14 ENCOUNTER — Emergency Department (HOSPITAL_COMMUNITY)
Admission: EM | Admit: 2016-06-14 | Discharge: 2016-06-14 | Disposition: A | Discharge status: Home / Self Care | Payer: Managed Care, Other (non HMO) | Attending: Emergency Medicine | Admitting: Emergency Medicine

## 2016-06-14 ENCOUNTER — Encounter (HOSPITAL_COMMUNITY): Payer: Self-pay | Admitting: Emergency Medicine

## 2016-06-14 DIAGNOSIS — S46911A Strain of unspecified muscle, fascia and tendon at shoulder and upper arm level, right arm, initial encounter: Secondary | ICD-10-CM

## 2016-06-14 DIAGNOSIS — Y929 Unspecified place or not applicable: Secondary | ICD-10-CM | POA: Insufficient documentation

## 2016-06-14 DIAGNOSIS — S46011A Strain of muscle(s) and tendon(s) of the rotator cuff of right shoulder, initial encounter: Secondary | ICD-10-CM | POA: Diagnosis not present

## 2016-06-14 DIAGNOSIS — Y9361 Activity, american tackle football: Secondary | ICD-10-CM | POA: Insufficient documentation

## 2016-06-14 DIAGNOSIS — Y999 Unspecified external cause status: Secondary | ICD-10-CM | POA: Diagnosis not present

## 2016-06-14 DIAGNOSIS — S4991XA Unspecified injury of right shoulder and upper arm, initial encounter: Secondary | ICD-10-CM | POA: Diagnosis present

## 2016-06-14 DIAGNOSIS — X58XXXA Exposure to other specified factors, initial encounter: Secondary | ICD-10-CM | POA: Diagnosis not present

## 2016-06-14 MED ORDER — KETOROLAC TROMETHAMINE 30 MG/ML IJ SOLN
30.0000 mg | Freq: Once | INTRAMUSCULAR | Status: AC
Start: 2016-06-14 — End: 2016-06-14
  Administered 2016-06-14: 30 mg via INTRAMUSCULAR
  Filled 2016-06-14: qty 1

## 2016-06-14 MED ORDER — OXYCODONE-ACETAMINOPHEN 5-325 MG PO TABS
1.0000 | ORAL_TABLET | Freq: Once | ORAL | Status: AC
  Administered 2016-06-14: 1 via ORAL
  Filled 2016-06-14: qty 1

## 2016-06-14 MED ORDER — NAPROXEN 500 MG PO TABS: 500.0000 mg | ORAL_TABLET | Freq: Two times a day (BID) | ORAL | 0 refills | Status: DC

## 2016-06-14 NOTE — ED Triage Notes (Addendum)
Pt presents with R shoulder pain after playing football 2 days ago. Deformity noted. States that he is able to lift it but it hurts. Hx of dislocation. Alert and oriented.

## 2016-06-14 NOTE — ED Provider Notes (Signed)
[facility]-EMERGENCY DEPT Provider Note   CSN: [id number] Arrival date & time: 06/14/16  0007  By signing my name below, I, Alyssa Grove, attest that this documentation has been prepared under the direction and in the presence of Shon Baton, MD. Electronically Signed: Alyssa Grove, ED Scribe. 06/14/16. 1:18 AM.   History   Chief Complaint Chief Complaint  Patient presents with  . Shoulder Injury   The history is provided by the patient. No language interpreter was used.   HPI Comments: Jeff Banks is a 20 y.o. male who presents to the Emergency Department complaining of gradual onset, constant, 8/10, right shoulder pain onset 2 days PTA. He reports difficulty with range of motion. He has kept it in a sling. He has taken ibuprofen with minimal relief. He has hx of dislocation of the same shoulder a few years ago. Denies weakness, numbness, tingling. Pt is right handed.  History reviewed. No pertinent past medical history.  There are no active problems to display for this patient.   Past Surgical History:  Procedure Laterality Date  . SHOULDER SURGERY      Home Medications    Prior to Admission medications   Medication Sig Start Date End Date Taking? Authorizing Provider  naproxen sodium (ANAPROX) 220 MG tablet Take 220 mg by mouth 2 (two) times daily with a meal.   Yes Historical Provider, MD  docusate sodium (COLACE) 100 MG capsule Take 1 capsule (100 mg total) by mouth every 12 (twelve) hours. Patient not taking: Reported on 06/12/2016 04/19/16   [name], DO  morphine (MSIR) 15 MG tablet Take 1 tablet (15 mg total) by mouth every 6 (six) hours as needed for severe pain. Patient not taking: Reported on 06/12/2016 05/09/16   Arthor Captain, [name]  naproxen (NAPROSYN) 500 MG tablet Take 1 tablet (500 mg total) by mouth 2 (two) times daily. 06/14/16   Shon Baton, MD  ondansetron (ZOFRAN) 4 MG tablet Take 1 tablet (4 mg total) by mouth every 6 (six) hours. Patient not  taking: Reported on 06/12/2016 12/23/15   Tharon Aquas, PA  polyethylene glycol powder (GLYCOLAX/MIRALAX) powder Take 17 g by mouth 2 (two) times daily. Patient not taking: Reported on 06/12/2016 08/31/15   Charm Rings, MD  triamcinolone cream (KENALOG) 0.1 % Apply topically 2 (two) times daily. Apply for 2 weeks. May use on face Patient not taking: Reported on 06/12/2016 05/03/13   Hayden Rasmussen, NP    Family History History reviewed. No pertinent family history.  Social History Social History  Substance Use Topics  . Smoking status: Never Smoker  . Smokeless tobacco: Never Used  . Alcohol use No     Allergies   Review of patient's allergies indicates no known allergies.   Review of Systems Review of Systems  Constitutional: Negative for fever.  Musculoskeletal: Positive for arthralgias.       Shoulder pain  Skin: Negative for wound.  Neurological: Negative for weakness and numbness.       - Tingling  All other systems reviewed and are negative.  Physical Exam Updated Vital Signs BP 136/87 (BP Location: Left Arm)   Pulse 92   Temp 98.8 F (37.1 C) (Oral)   Resp 18   Ht 6\' 1"  (1.854 m)   Wt 159 lb 9 oz (72.4 kg)   SpO2 100%   BMI 21.05 kg/m   Physical Exam  Constitutional: He is oriented to person, place, and time. He appears well-developed and well-nourished. No  distress.  HENT:  Head: Normocephalic and atraumatic.  Cardiovascular: Normal rate, regular rhythm and normal heart sounds.   Pulmonary/Chest: Effort normal. No respiratory distress.  Abdominal: Soft. There is no tenderness.  Musculoskeletal: He exhibits no edema.  Shoulder asymmetry noted, tenderness to palpation at the right [facility] joint with apparent separation, no obvious deformities, humeral head appears in place, limited range of motion secondary to pain, 2+ radial pulse and 5 out of 5 strength in all 4 extremities  Neurological: He is alert and oriented to person, place, and time.  Skin: Skin is warm  and dry.  Psychiatric: He has a normal mood and affect.  Nursing note and vitals reviewed.    ED Treatments / Results  DIAGNOSTIC STUDIES: Oxygen Saturation is 100% on RA, normal by my interpretation.    COORDINATION OF CARE: 1:07 AM Discussed treatment plan with pt at bedside which includes DG Shoulder and pt agreed to plan.  Labs (all labs ordered are listed, but only abnormal results are displayed) Labs Reviewed - No data to display  EKG  EKG Interpretation None      Radiology Dg Shoulder Right  Result Date: 06/14/2016 CLINICAL DATA:  Injury to right shoulder, with right anterior lateral shoulder pain. Initial encounter. EXAM: RIGHT SHOULDER - 2+ VIEW COMPARISON:  Right shoulder radiographs performed 06/11/2016 FINDINGS: There is no evidence of fracture or dislocation. The right humeral head is seated within the glenoid fossa. The acromioclavicular joint is unremarkable in appearance. No significant soft tissue abnormalities are seen. The visualized portions of the right lung are clear. IMPRESSION: No evidence of fracture or dislocation. Electronically Signed   By: Roanna RaiderJeffery  Chang M.D.   On: 06/14/2016 01:33   Procedures Procedures (including critical care time)  Medications Ordered in ED Medications  oxyCODONE-acetaminophen (PERCOCET/ROXICET) 5-325 MG per tablet 1 tablet (1 tablet Oral Given 06/14/16 0124)     Initial Impression / Assessment and Plan / ED Course  I have reviewed the triage vital signs and the nursing notes.  Pertinent labs & imaging results that were available during my care of the patient were reviewed by me and considered in my medical decision making (see chart for details).  Clinical Course   Patient presents with right shoulder pain. This is been ongoing for 2 days. Reports he hurt it playing sports. History of injury to that shoulder previously. He has tenderness to palpation at the [facility] joint with some separation. Range of motion difficult to  assess secondary to pain. Patient was given a Percocet. X-rays obtained. No evidence of dislocation or fracture. On recheck, patient was improved range of motion following pain medication. Given that he has had arm in the sling for 2 days, he is at risk for frozen shoulder. Suspect he may have underlying shoulder separation/AC injury and/or ligamentous injury. I have encouraged range of motion exercises for the patient. Naproxen for pain. Follow-up with orthopedics. He is neurovascularly intact.  After history, exam, and medical workup I feel the patient has been appropriately medically screened and is safe for discharge home. Pertinent diagnoses were discussed with the patient. Patient was given return precautions.   I personally performed the services described in this documentation, which was scribed in my presence. The recorded information has been reviewed and is accurate.   Final Clinical Impressions(s) / ED Diagnoses   Final diagnoses:  Strain of right shoulder, initial encounter    New Prescriptions Current Discharge Medication List       Shon Batonourtney F Horton, MD  06/14/16 0302  

## 2016-06-14 NOTE — Discharge Instructions (Signed)
You were seen today for right shoulder pain. There is no evidence of dislocation or fracture. It is very important that you do range of motion exercises to keep your foot shoulder from stiffening up. Follow-up with orthopedics.

## 2016-06-14 NOTE — ED Notes (Signed)
Pt alert and oriented upon discharge. In no acute distress,  Ambulatory to check out.

## 2016-06-14 NOTE — ED Notes (Signed)
Pt asked for medication to help with pain. MD aware.

## 2016-07-06 ENCOUNTER — Emergency Department (HOSPITAL_COMMUNITY)
Admission: EM | Admit: 2016-07-06 | Discharge: 2016-07-06 | Disposition: A | Discharge status: Home / Self Care | Payer: Managed Care, Other (non HMO) | Attending: Emergency Medicine | Admitting: Emergency Medicine

## 2016-07-06 ENCOUNTER — Emergency Department (HOSPITAL_COMMUNITY): Payer: Managed Care, Other (non HMO)

## 2016-07-06 ENCOUNTER — Encounter (HOSPITAL_COMMUNITY): Payer: Self-pay | Admitting: Emergency Medicine

## 2016-07-06 DIAGNOSIS — M545 Low back pain: Secondary | ICD-10-CM | POA: Diagnosis not present

## 2016-07-06 DIAGNOSIS — R6884 Jaw pain: Secondary | ICD-10-CM | POA: Diagnosis not present

## 2016-07-06 DIAGNOSIS — M546 Pain in thoracic spine: Secondary | ICD-10-CM | POA: Diagnosis not present

## 2016-07-06 DIAGNOSIS — M79621 Pain in right upper arm: Secondary | ICD-10-CM | POA: Diagnosis not present

## 2016-07-06 DIAGNOSIS — Y999 Unspecified external cause status: Secondary | ICD-10-CM | POA: Insufficient documentation

## 2016-07-06 DIAGNOSIS — Y939 Activity, unspecified: Secondary | ICD-10-CM | POA: Insufficient documentation

## 2016-07-06 DIAGNOSIS — Y929 Unspecified place or not applicable: Secondary | ICD-10-CM | POA: Insufficient documentation

## 2016-07-06 DIAGNOSIS — T07XXXA Unspecified multiple injuries, initial encounter: Secondary | ICD-10-CM

## 2016-07-06 DIAGNOSIS — M542 Cervicalgia: Secondary | ICD-10-CM | POA: Diagnosis present

## 2016-07-06 DIAGNOSIS — R109 Unspecified abdominal pain: Secondary | ICD-10-CM | POA: Insufficient documentation

## 2016-07-06 LAB — I-STAT CHEM 8, ED
BUN: 8 mg/dL (ref 6–20)
Calcium, Ion: 1.13 mmol/L — ABNORMAL LOW (ref 1.15–1.40)
Chloride: 102 mmol/L (ref 101–111)
Creatinine, Ser: 1 mg/dL (ref 0.61–1.24)
Glucose, Bld: 110 mg/dL — ABNORMAL HIGH (ref 65–99)
HEMATOCRIT: 41 % (ref 39.0–52.0)
HEMOGLOBIN: 13.9 g/dL (ref 13.0–17.0)
POTASSIUM: 3.4 mmol/L — AB (ref 3.5–5.1)
SODIUM: 143 mmol/L (ref 135–145)
TCO2: 27 mmol/L (ref 0–100)

## 2016-07-06 MED ORDER — IOPAMIDOL (ISOVUE-300) INJECTION 61%
INTRAVENOUS | Status: AC
  Administered 2016-07-06: 100 mL
  Filled 2016-07-06: qty 100

## 2016-07-06 MED ORDER — IBUPROFEN 400 MG PO TABS: 400.0000 mg | ORAL_TABLET | Freq: Three times a day (TID) | ORAL | 0 refills | Status: DC | PRN

## 2016-07-06 MED ORDER — FENTANYL CITRATE (PF) 100 MCG/2ML IJ SOLN
50.0000 ug | Freq: Once | INTRAMUSCULAR | Status: AC
  Administered 2016-07-06: 50 ug via INTRAVENOUS
  Filled 2016-07-06: qty 2
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
## 2016-07-06 NOTE — ED Notes (Signed)
Patient transported to X-ray 
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
## 2016-07-13 ENCOUNTER — Emergency Department (HOSPITAL_COMMUNITY)
Admission: EM | Admit: 2016-07-13 | Discharge: 2016-07-13 | Disposition: A | Discharge status: Home / Self Care | Payer: Managed Care, Other (non HMO) | Source: Home / Self Care | Attending: Emergency Medicine | Admitting: Emergency Medicine

## 2016-07-13 ENCOUNTER — Encounter (HOSPITAL_COMMUNITY): Payer: Self-pay | Admitting: Nurse Practitioner

## 2016-07-13 ENCOUNTER — Emergency Department (HOSPITAL_COMMUNITY)
Admission: EM | Admit: 2016-07-13 | Discharge: 2016-07-13 | Disposition: A | Discharge status: Home / Self Care | Payer: Managed Care, Other (non HMO) | Attending: Emergency Medicine | Admitting: Emergency Medicine

## 2016-07-13 ENCOUNTER — Emergency Department (HOSPITAL_COMMUNITY): Payer: Managed Care, Other (non HMO)

## 2016-07-13 ENCOUNTER — Encounter (HOSPITAL_COMMUNITY): Payer: Self-pay | Admitting: Emergency Medicine

## 2016-07-13 DIAGNOSIS — Y939 Activity, unspecified: Secondary | ICD-10-CM | POA: Diagnosis not present

## 2016-07-13 DIAGNOSIS — Y929 Unspecified place or not applicable: Secondary | ICD-10-CM | POA: Diagnosis not present

## 2016-07-13 DIAGNOSIS — S3992XA Unspecified injury of lower back, initial encounter: Secondary | ICD-10-CM | POA: Diagnosis present

## 2016-07-13 DIAGNOSIS — F17200 Nicotine dependence, unspecified, uncomplicated: Secondary | ICD-10-CM | POA: Insufficient documentation

## 2016-07-13 DIAGNOSIS — Y999 Unspecified external cause status: Secondary | ICD-10-CM | POA: Diagnosis not present

## 2016-07-13 DIAGNOSIS — M545 Low back pain, unspecified: Secondary | ICD-10-CM

## 2016-07-13 DIAGNOSIS — S300XXA Contusion of lower back and pelvis, initial encounter: Secondary | ICD-10-CM | POA: Diagnosis not present

## 2016-07-13 MED ORDER — DIAZEPAM 5 MG PO TABS
5.0000 mg | ORAL_TABLET | Freq: Once | ORAL | Status: AC
  Administered 2016-07-13: 5 mg via ORAL
  Filled 2016-07-13: qty 1

## 2016-07-13 MED ORDER — KETOROLAC TROMETHAMINE 30 MG/ML IJ SOLN
30.0000 mg | Freq: Once | INTRAMUSCULAR | Status: AC
  Administered 2016-07-13: 30 mg via INTRAMUSCULAR
  Filled 2016-07-13: qty 1

## 2016-07-13 MED ORDER — CYCLOBENZAPRINE HCL 10 MG PO TABS: 10.0000 mg | ORAL_TABLET | Freq: Two times a day (BID) | ORAL | 0 refills | Status: DC | PRN

## 2016-07-13 MED ORDER — DICLOFENAC SODIUM 50 MG PO TBEC: 50.0000 mg | DELAYED_RELEASE_TABLET | Freq: Two times a day (BID) | ORAL | 0 refills | Status: DC

## 2016-07-13 NOTE — Discharge Instructions (Signed)
Do not take the muscle relaxant if driving as it will make you sleepy.  °

## 2016-07-13 NOTE — ED Notes (Signed)
Pt ambulatory and independent at discharge.  Verbalized understanding of discharge instructions 
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
## 2016-07-13 NOTE — Discharge Instructions (Signed)
Please read attached information. If you experience any new or worsening signs or symptoms please return to the emergency room for evaluation. Please follow-up with your primary care provider or specialist as discussed.  °
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
## 2016-07-16 ENCOUNTER — Emergency Department (HOSPITAL_COMMUNITY): Payer: Managed Care, Other (non HMO)

## 2016-07-16 ENCOUNTER — Encounter (HOSPITAL_COMMUNITY): Payer: Self-pay

## 2016-07-16 ENCOUNTER — Emergency Department (HOSPITAL_COMMUNITY)
Admission: EM | Admit: 2016-07-16 | Discharge: 2016-07-16 | Disposition: A | Discharge status: Home / Self Care | Payer: Managed Care, Other (non HMO) | Attending: Emergency Medicine | Admitting: Emergency Medicine

## 2016-07-16 DIAGNOSIS — M25521 Pain in right elbow: Secondary | ICD-10-CM | POA: Diagnosis not present

## 2016-07-16 DIAGNOSIS — Y929 Unspecified place or not applicable: Secondary | ICD-10-CM | POA: Diagnosis not present

## 2016-07-16 DIAGNOSIS — M549 Dorsalgia, unspecified: Secondary | ICD-10-CM | POA: Diagnosis present

## 2016-07-16 DIAGNOSIS — M545 Low back pain: Secondary | ICD-10-CM | POA: Insufficient documentation

## 2016-07-16 DIAGNOSIS — Y999 Unspecified external cause status: Secondary | ICD-10-CM | POA: Diagnosis not present

## 2016-07-16 DIAGNOSIS — Y939 Activity, unspecified: Secondary | ICD-10-CM | POA: Insufficient documentation

## 2016-07-16 DIAGNOSIS — G8929 Other chronic pain: Secondary | ICD-10-CM | POA: Diagnosis not present

## 2016-07-16 MED ORDER — KETOROLAC TROMETHAMINE 60 MG/2ML IM SOLN
30.0000 mg | Freq: Once | INTRAMUSCULAR | Status: AC
  Administered 2016-07-16: 30 mg via INTRAMUSCULAR
  Filled 2016-07-16: qty 2
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
## 2016-07-16 NOTE — Discharge Instructions (Signed)
Ibuprofen or tylenol for pain. Ice and elevate. Follow up with Dr. Thurston HoleWainer.

## 2016-07-28 ENCOUNTER — Emergency Department (HOSPITAL_COMMUNITY)
Admission: EM | Admit: 2016-07-28 | Discharge: 2016-07-28 | Disposition: A | Discharge status: Home / Self Care | Payer: Managed Care, Other (non HMO) | Attending: Emergency Medicine | Admitting: Emergency Medicine

## 2016-07-28 ENCOUNTER — Encounter (HOSPITAL_COMMUNITY): Payer: Self-pay | Admitting: *Deleted

## 2016-07-28 DIAGNOSIS — M545 Low back pain, unspecified: Secondary | ICD-10-CM

## 2016-07-28 DIAGNOSIS — F17200 Nicotine dependence, unspecified, uncomplicated: Secondary | ICD-10-CM | POA: Diagnosis not present

## 2016-07-28 DIAGNOSIS — Y929 Unspecified place or not applicable: Secondary | ICD-10-CM | POA: Diagnosis not present

## 2016-07-28 DIAGNOSIS — Y939 Activity, unspecified: Secondary | ICD-10-CM | POA: Insufficient documentation

## 2016-07-28 DIAGNOSIS — Y999 Unspecified external cause status: Secondary | ICD-10-CM | POA: Diagnosis not present

## 2016-07-28 LAB — COMPREHENSIVE METABOLIC PANEL
ALT: 43 U/L (ref 17–63)
ANION GAP: 7 (ref 5–15)
AST: 35 U/L (ref 15–41)
Albumin: 4.5 g/dL (ref 3.5–5.0)
Alkaline Phosphatase: 70 U/L (ref 38–126)
BUN: 7 mg/dL (ref 6–20)
CHLORIDE: 103 mmol/L (ref 101–111)
CO2: 29 mmol/L (ref 22–32)
Calcium: 9.7 mg/dL (ref 8.9–10.3)
Creatinine, Ser: 1.06 mg/dL (ref 0.61–1.24)
GFR calc non Af Amer: >60 mL/min (ref >60)
GLUCOSE: 126 mg/dL — AB (ref 65–99)
Potassium: 4.3 mmol/L (ref 3.5–5.1)
Sodium: 139 mmol/L (ref 135–145)
Total Bilirubin: 0.9 mg/dL (ref 0.3–1.2)
Total Protein: 7.8 g/dL (ref 6.5–8.1)

## 2016-07-28 LAB — URINALYSIS, ROUTINE W REFLEX MICROSCOPIC
Bilirubin Urine: NEGATIVE
GLUCOSE, UA: NEGATIVE mg/dL
HGB URINE DIPSTICK: NEGATIVE
Ketones, ur: NEGATIVE mg/dL
Leukocytes, UA: NEGATIVE
Nitrite: NEGATIVE
PH: 8 (ref 5.0–8.0)
Protein, ur: NEGATIVE mg/dL
SPECIFIC GRAVITY, URINE: 1.006 (ref 1.005–1.030)

## 2016-07-28 LAB — CBC
HEMATOCRIT: 43.9 % (ref 39.0–52.0)
HEMOGLOBIN: 14.5 g/dL (ref 13.0–17.0)
MCH: 27.8 pg (ref 26.0–34.0)
MCHC: 33 g/dL (ref 30.0–36.0)
MCV: 84.3 fL (ref 78.0–100.0)
Platelets: 274 10*3/uL (ref 150–400)
RBC: 5.21 MIL/uL (ref 4.22–5.81)
RDW: 13.3 % (ref 11.5–15.5)
WBC: 3.9 10*3/uL — ABNORMAL LOW (ref 4.0–10.5)

## 2016-07-28 LAB — LIPASE, BLOOD: Lipase: 26 U/L (ref 11–51)

## 2016-07-28 MED ORDER — HYDROCODONE-ACETAMINOPHEN 5-325 MG PO TABS
1.0000 | ORAL_TABLET | Freq: Once | ORAL | Status: AC
  Administered 2016-07-28: 1 via ORAL
  Filled 2016-07-28: qty 1
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
## 2016-08-05 ENCOUNTER — Encounter (HOSPITAL_COMMUNITY): Payer: Self-pay | Admitting: Emergency Medicine

## 2016-08-05 ENCOUNTER — Emergency Department (HOSPITAL_COMMUNITY): Payer: Managed Care, Other (non HMO)

## 2016-08-05 ENCOUNTER — Emergency Department (HOSPITAL_COMMUNITY)
Admission: EM | Admit: 2016-08-05 | Discharge: 2016-08-05 | Discharge status: Against Medical Advice | Payer: Managed Care, Other (non HMO) | Attending: Emergency Medicine | Admitting: Emergency Medicine

## 2016-08-05 DIAGNOSIS — R1031 Right lower quadrant pain: Secondary | ICD-10-CM | POA: Insufficient documentation

## 2016-08-05 DIAGNOSIS — F17200 Nicotine dependence, unspecified, uncomplicated: Secondary | ICD-10-CM | POA: Insufficient documentation

## 2016-08-05 DIAGNOSIS — Z79899 Other long term (current) drug therapy: Secondary | ICD-10-CM | POA: Insufficient documentation

## 2016-08-05 LAB — CBC WITH DIFFERENTIAL/PLATELET
BASOS PCT: 1 %
Basophils Absolute: 0 10*3/uL (ref 0.0–0.1)
EOS ABS: 0 10*3/uL (ref 0.0–0.7)
EOS PCT: 1 %
HCT: 41.6 % (ref 39.0–52.0)
HEMOGLOBIN: 13.8 g/dL (ref 13.0–17.0)
Lymphocytes Relative: 27 %
Lymphs Abs: 1 10*3/uL (ref 0.7–4.0)
MCH: 27.8 pg (ref 26.0–34.0)
MCHC: 33.2 g/dL (ref 30.0–36.0)
MCV: 83.7 fL (ref 78.0–100.0)
MONOS PCT: 9 %
Monocytes Absolute: 0.3 10*3/uL (ref 0.1–1.0)
NEUTROS PCT: 62 %
Neutro Abs: 2.2 10*3/uL (ref 1.7–7.7)
PLATELETS: 182 10*3/uL (ref 150–400)
RBC: 4.97 MIL/uL (ref 4.22–5.81)
RDW: 13.5 % (ref 11.5–15.5)
WBC: 3.6 10*3/uL — ABNORMAL LOW (ref 4.0–10.5)

## 2016-08-05 LAB — COMPREHENSIVE METABOLIC PANEL
ALK PHOS: 53 U/L (ref 38–126)
ALT: 23 U/L (ref 17–63)
AST: 24 U/L (ref 15–41)
Albumin: 4.1 g/dL (ref 3.5–5.0)
Anion gap: 10 (ref 5–15)
BUN: 8 mg/dL (ref 6–20)
CALCIUM: 9.4 mg/dL (ref 8.9–10.3)
CO2: 25 mmol/L (ref 22–32)
CREATININE: 0.85 mg/dL (ref 0.61–1.24)
Chloride: 103 mmol/L (ref 101–111)
Glucose, Bld: 88 mg/dL (ref 65–99)
Potassium: 4.1 mmol/L (ref 3.5–5.1)
SODIUM: 138 mmol/L (ref 135–145)
Total Bilirubin: 1 mg/dL (ref 0.3–1.2)
Total Protein: 7 g/dL (ref 6.5–8.1)

## 2016-08-05 LAB — URINALYSIS, ROUTINE W REFLEX MICROSCOPIC
BILIRUBIN URINE: NEGATIVE
GLUCOSE, UA: NEGATIVE mg/dL
HGB URINE DIPSTICK: NEGATIVE
KETONES UR: NEGATIVE mg/dL
Leukocytes, UA: NEGATIVE
Nitrite: NEGATIVE
PROTEIN: NEGATIVE mg/dL
Specific Gravity, Urine: 1.012 (ref 1.005–1.030)
pH: 8.5 — ABNORMAL HIGH (ref 5.0–8.0)

## 2016-08-05 LAB — LIPASE, BLOOD: LIPASE: 16 U/L (ref 11–51)

## 2016-08-05 MED ORDER — IOPAMIDOL (ISOVUE-300) INJECTION 61%
INTRAVENOUS | Status: AC
  Filled 2016-08-05: qty 100

## 2016-08-05 MED ORDER — MORPHINE SULFATE (PF) 4 MG/ML IV SOLN
4.0000 mg | Freq: Once | INTRAVENOUS | Status: AC
  Administered 2016-08-05: 4 mg via INTRAVENOUS
  Filled 2016-08-05: qty 1

## 2016-08-05 NOTE — ED Notes (Signed)
Called patient's listed phone number without a response and unable to leave voicemail.
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
## 2016-09-05 ENCOUNTER — Encounter (HOSPITAL_COMMUNITY): Payer: Self-pay | Admitting: Emergency Medicine

## 2016-09-05 ENCOUNTER — Emergency Department (HOSPITAL_COMMUNITY)
Admission: EM | Admit: 2016-09-05 | Discharge: 2016-09-05 | Disposition: A | Discharge status: Home / Self Care | Payer: Managed Care, Other (non HMO) | Attending: Dermatology | Admitting: Dermatology

## 2016-09-05 DIAGNOSIS — Z79899 Other long term (current) drug therapy: Secondary | ICD-10-CM | POA: Insufficient documentation

## 2016-09-05 DIAGNOSIS — Z5321 Procedure and treatment not carried out due to patient leaving prior to being seen by health care provider: Secondary | ICD-10-CM | POA: Diagnosis not present

## 2016-09-05 DIAGNOSIS — K0889 Other specified disorders of teeth and supporting structures: Secondary | ICD-10-CM | POA: Insufficient documentation

## 2016-09-05 DIAGNOSIS — F17200 Nicotine dependence, unspecified, uncomplicated: Secondary | ICD-10-CM | POA: Diagnosis not present

## 2016-09-05 NOTE — ED Triage Notes (Signed)
Patient reports left upper dental pain for 3 weeks unrelieved by OTC pain medications .

## 2017-08-04 ENCOUNTER — Emergency Department (HOSPITAL_COMMUNITY): Payer: PRIVATE HEALTH INSURANCE

## 2017-08-04 ENCOUNTER — Encounter (HOSPITAL_COMMUNITY): Payer: Self-pay | Admitting: Emergency Medicine

## 2017-08-04 ENCOUNTER — Other Ambulatory Visit: Payer: Self-pay

## 2017-08-04 ENCOUNTER — Emergency Department (HOSPITAL_COMMUNITY)
Admission: EM | Admit: 2017-08-04 | Discharge: 2017-08-04 | Disposition: A | Discharge status: Home / Self Care | Payer: PRIVATE HEALTH INSURANCE | Attending: Emergency Medicine | Admitting: Emergency Medicine

## 2017-08-04 DIAGNOSIS — F17200 Nicotine dependence, unspecified, uncomplicated: Secondary | ICD-10-CM | POA: Insufficient documentation

## 2017-08-04 DIAGNOSIS — Y9389 Activity, other specified: Secondary | ICD-10-CM | POA: Diagnosis not present

## 2017-08-04 DIAGNOSIS — Y999 Unspecified external cause status: Secondary | ICD-10-CM | POA: Diagnosis not present

## 2017-08-04 DIAGNOSIS — S098XXA Other specified injuries of head, initial encounter: Secondary | ICD-10-CM | POA: Diagnosis not present

## 2017-08-04 DIAGNOSIS — R51 Headache: Secondary | ICD-10-CM | POA: Diagnosis not present

## 2017-08-04 DIAGNOSIS — Y92410 Unspecified street and highway as the place of occurrence of the external cause: Secondary | ICD-10-CM | POA: Diagnosis not present

## 2017-08-04 DIAGNOSIS — S0990XA Unspecified injury of head, initial encounter: Secondary | ICD-10-CM

## 2017-08-04 MED ORDER — IBUPROFEN 800 MG PO TABS
800.0000 mg | ORAL_TABLET | Freq: Once | ORAL | Status: AC
  Administered 2017-08-04: 800 mg via ORAL
  Filled 2017-08-04: qty 1

## 2017-08-04 MED ORDER — IBUPROFEN 800 MG PO TABS: 800.0000 mg | ORAL_TABLET | Freq: Three times a day (TID) | ORAL | 0 refills | Status: DC

## 2017-08-04 NOTE — ED Notes (Addendum)
Patient refusing x ray

## 2017-08-04 NOTE — ED Notes (Signed)
Pt and family understood dc material. NAD noted. Script given at dc 
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
## 2017-08-04 NOTE — ED Notes (Signed)
Patient refused ordered xrays. MD made aware
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
## 2018-02-02 ENCOUNTER — Emergency Department (HOSPITAL_COMMUNITY): Payer: PRIVATE HEALTH INSURANCE

## 2018-02-02 ENCOUNTER — Encounter (HOSPITAL_COMMUNITY): Payer: Self-pay | Admitting: Emergency Medicine

## 2018-02-02 ENCOUNTER — Other Ambulatory Visit: Payer: Self-pay

## 2018-02-02 ENCOUNTER — Emergency Department (HOSPITAL_COMMUNITY)
Admission: EM | Admit: 2018-02-02 | Discharge: 2018-02-02 | Disposition: A | Discharge status: Home / Self Care | Payer: PRIVATE HEALTH INSURANCE | Attending: Emergency Medicine | Admitting: Emergency Medicine

## 2018-02-02 DIAGNOSIS — Z23 Encounter for immunization: Secondary | ICD-10-CM | POA: Insufficient documentation

## 2018-02-02 DIAGNOSIS — Z79899 Other long term (current) drug therapy: Secondary | ICD-10-CM | POA: Insufficient documentation

## 2018-02-02 DIAGNOSIS — W3400XA Accidental discharge from unspecified firearms or gun, initial encounter: Secondary | ICD-10-CM | POA: Insufficient documentation

## 2018-02-02 DIAGNOSIS — F17210 Nicotine dependence, cigarettes, uncomplicated: Secondary | ICD-10-CM | POA: Insufficient documentation

## 2018-02-02 DIAGNOSIS — Y999 Unspecified external cause status: Secondary | ICD-10-CM | POA: Insufficient documentation

## 2018-02-02 DIAGNOSIS — Y92410 Unspecified street and highway as the place of occurrence of the external cause: Secondary | ICD-10-CM | POA: Insufficient documentation

## 2018-02-02 DIAGNOSIS — S81801A Unspecified open wound, right lower leg, initial encounter: Secondary | ICD-10-CM | POA: Insufficient documentation

## 2018-02-02 DIAGNOSIS — Y939 Activity, unspecified: Secondary | ICD-10-CM | POA: Insufficient documentation

## 2018-02-02 MED ORDER — CEFAZOLIN SODIUM-DEXTROSE 1-4 GM/50ML-% IV SOLN
1.0000 g | Freq: Once | INTRAVENOUS | Status: AC
  Administered 2018-02-02: 1 g via INTRAVENOUS
  Filled 2018-02-02: qty 50

## 2018-02-02 MED ORDER — HYDROMORPHONE HCL 2 MG/ML IJ SOLN
1.0000 mg | Freq: Once | INTRAMUSCULAR | Status: AC
  Administered 2018-02-02: 1 mg via INTRAVENOUS
  Filled 2018-02-02: qty 1

## 2018-02-02 MED ORDER — OXYCODONE-ACETAMINOPHEN 5-325 MG PO TABS: 1.0000 | ORAL_TABLET | ORAL | 0 refills | Status: DC | PRN

## 2018-02-02 MED ORDER — CEPHALEXIN 500 MG PO CAPS: 500.0000 mg | ORAL_CAPSULE | Freq: Four times a day (QID) | ORAL | 0 refills | Status: DC

## 2018-02-02 MED ORDER — TETANUS-DIPHTH-ACELL PERTUSSIS 5-2.5-18.5 LF-MCG/0.5 IM SUSP
0.5000 mL | Freq: Once | INTRAMUSCULAR | Status: AC
  Administered 2018-02-02: 0.5 mL via INTRAMUSCULAR
  Filled 2018-02-02: qty 0.5
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
## 2018-02-02 NOTE — Discharge Instructions (Signed)
Keep wounds clean and covered. Take the antibiotics to help prevent infection. Take the percocet for severe pain.  Can take motrin for moderate pain. Follow-up in the trauma clinic-- call on Monday to make appt. Return here for any new/acute changes.

## 2018-02-02 NOTE — ED Notes (Signed)
Instructed pt how to use crutches. Discharge instructions and prescriptiond discussed with Pt. Pt verbalized understanding. Pt stable and ambulatory with crutches.
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
## 2022-02-24 ENCOUNTER — Encounter (HOSPITAL_COMMUNITY): Payer: Self-pay | Admitting: *Deleted

## 2022-02-24 ENCOUNTER — Other Ambulatory Visit: Payer: Self-pay

## 2022-02-24 ENCOUNTER — Ambulatory Visit (HOSPITAL_COMMUNITY)
Admission: EM | Admit: 2022-02-24 | Discharge: 2022-02-24 | Disposition: A | Discharge status: Home / Self Care | Payer: Commercial Managed Care - HMO | Attending: Emergency Medicine | Admitting: Emergency Medicine

## 2022-02-24 DIAGNOSIS — A084 Viral intestinal infection, unspecified: Secondary | ICD-10-CM

## 2022-02-24 MED ORDER — ONDANSETRON 4 MG PO TBDP: 4.0000 mg | ORAL_TABLET | Freq: Three times a day (TID) | ORAL | 0 refills | Status: DC | PRN
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
## 2022-02-27 ENCOUNTER — Ambulatory Visit (HOSPITAL_COMMUNITY)
Admission: EM | Admit: 2022-02-27 | Discharge: 2022-02-27 | Disposition: A | Discharge status: Home / Self Care | Payer: Commercial Managed Care - HMO | Attending: Emergency Medicine | Admitting: Emergency Medicine

## 2022-02-27 ENCOUNTER — Encounter (HOSPITAL_COMMUNITY): Payer: Self-pay

## 2022-02-27 DIAGNOSIS — R1084 Generalized abdominal pain: Secondary | ICD-10-CM | POA: Diagnosis not present

## 2022-02-27 MED ORDER — ALUM & MAG HYDROXIDE-SIMETH 200-200-20 MG/5ML PO SUSP
30.0000 mL | Freq: Once | ORAL | Status: AC
  Administered 2022-02-27: 30 mL via ORAL

## 2022-02-27 MED ORDER — LIDOCAINE VISCOUS HCL 2 % MT SOLN
OROMUCOSAL | Status: AC
  Filled 2022-02-27: qty 15

## 2022-02-27 MED ORDER — ALUM & MAG HYDROXIDE-SIMETH 200-200-20 MG/5ML PO SUSP
ORAL | Status: AC
  Filled 2022-02-27: qty 30

## 2022-02-27 MED ORDER — LIDOCAINE VISCOUS HCL 2 % MT SOLN
15.0000 mL | Freq: Once | OROMUCOSAL | Status: AC
  Administered 2022-02-27: 15 mL via ORAL

## 2022-08-11 ENCOUNTER — Emergency Department (HOSPITAL_COMMUNITY): Payer: Commercial Managed Care - HMO

## 2022-08-11 ENCOUNTER — Other Ambulatory Visit (HOSPITAL_COMMUNITY): Payer: Commercial Managed Care - HMO

## 2022-08-11 ENCOUNTER — Other Ambulatory Visit: Payer: Self-pay

## 2022-08-11 ENCOUNTER — Emergency Department (HOSPITAL_COMMUNITY)
Admission: EM | Admit: 2022-08-11 | Discharge: 2022-08-12 | Disposition: A | Discharge status: Home / Self Care | Payer: Commercial Managed Care - HMO | Attending: Emergency Medicine | Admitting: Emergency Medicine

## 2022-08-11 ENCOUNTER — Encounter (HOSPITAL_COMMUNITY): Payer: Self-pay | Admitting: *Deleted

## 2022-08-11 DIAGNOSIS — M25562 Pain in left knee: Secondary | ICD-10-CM | POA: Insufficient documentation

## 2022-08-11 DIAGNOSIS — Y9355 Activity, bike riding: Secondary | ICD-10-CM | POA: Diagnosis not present

## 2022-08-11 MED ORDER — IBUPROFEN 600 MG PO TABS: 600.0000 mg | ORAL_TABLET | Freq: Four times a day (QID) | ORAL | 0 refills | Status: DC | PRN

## 2022-08-11 MED ORDER — IBUPROFEN 400 MG PO TABS
600.0000 mg | ORAL_TABLET | Freq: Once | ORAL | Status: AC
  Administered 2022-08-11: 600 mg via ORAL
  Filled 2022-08-11: qty 1

## 2022-08-11 NOTE — ED Notes (Signed)
Patient to room after scan 

## 2022-08-11 NOTE — ED Notes (Signed)
Patient's family member at nurses station asking about patient getting stronger medicine for his pain.  When pain assessed, patient states it hurts "a little."  Patient's family member also asking about script for something strong when he is d/c'd.  PA Sherral Hammers made aware.

## 2022-08-11 NOTE — ED Notes (Signed)
Ortho tech called 
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
## 2022-08-11 NOTE — Discharge Instructions (Addendum)
The workup today was overall reassuring.  As discussed, imaging studies were negative for any acute fracture or dislocation.  Recommend follow-up with orthopedics outpatient surgery with appointment.  In the meantime, recommend using knee immobilizer as well as crutches to help aid in ambulation.  Rest, ice, elevate affected extremity take NSAID in the form of ibuprofen as needed for pain/inflammation.  Please not hesitate to return to emergency department for worrisome signs and symptoms we discussed, apparent.

## 2022-08-12 NOTE — ED Notes (Signed)
Patient refused d/c vitals.

## 2022-08-12 NOTE — Progress Notes (Signed)
Orthopedic Tech Progress Note Patient Details:  Jeff Banks 1995/12/03 [id number]  Ortho Devices Type of Ortho Device: Crutches, Knee Immobilizer Ortho Device/Splint Location: lle Ortho Device/Splint Interventions: Ordered, Application, Adjustment   Post Interventions Patient Tolerated: Well Instructions Provided: Care of device, Adjustment of device  Trinna Post 08/12/2022, 12:20 AM

## 2023-01-07 ENCOUNTER — Ambulatory Visit (INDEPENDENT_AMBULATORY_CARE_PROVIDER_SITE_OTHER): Payer: Commercial Managed Care - HMO

## 2023-01-07 ENCOUNTER — Ambulatory Visit (HOSPITAL_COMMUNITY)
Admission: EM | Admit: 2023-01-07 | Discharge: 2023-01-07 | Disposition: A | Discharge status: Home / Self Care | Payer: Commercial Managed Care - HMO | Attending: Emergency Medicine | Admitting: Emergency Medicine

## 2023-01-07 ENCOUNTER — Encounter (HOSPITAL_COMMUNITY): Payer: Self-pay | Admitting: Emergency Medicine

## 2023-01-07 DIAGNOSIS — S62521A Displaced fracture of distal phalanx of right thumb, initial encounter for closed fracture: Secondary | ICD-10-CM
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
## 2023-01-07 NOTE — Discharge Instructions (Signed)
On exam you are unable to fully bend and in your thumb which is not normal, on imaging there is a small break at the tip of your finger  Keep wearing brace at all times to provide stability and support  You have been given information to the orthopedic specialist, please schedule follow-up appointment within the next week for reevaluation, they will determine if anything needs to be done to your finger and timeline for healing  You may take Tylenol and/or Motrin every 6 hours as needed for pain

## 2023-01-07 NOTE — ED Triage Notes (Addendum)
Pt reports injured right thumb a couple days ago when fell playing basketball. Wearing a brace that got himself. Reports does help some. Also took some Tylenol.

## 2023-03-12 ENCOUNTER — Encounter (HOSPITAL_COMMUNITY): Payer: Self-pay

## 2023-03-12 ENCOUNTER — Ambulatory Visit (HOSPITAL_COMMUNITY)
Admission: EM | Admit: 2023-03-12 | Discharge: 2023-03-12 | Disposition: A | Discharge status: Home / Self Care | Payer: Commercial Managed Care - HMO | Attending: Emergency Medicine | Admitting: Emergency Medicine

## 2023-03-12 DIAGNOSIS — K529 Noninfective gastroenteritis and colitis, unspecified: Secondary | ICD-10-CM

## 2023-03-12 MED ORDER — ONDANSETRON 4 MG PO TBDP
ORAL_TABLET | ORAL | Status: AC
  Filled 2023-03-12: qty 1

## 2023-03-12 MED ORDER — ONDANSETRON 4 MG PO TBDP
4.0000 mg | ORAL_TABLET | Freq: Once | ORAL | Status: AC
  Administered 2023-03-12: 4 mg via ORAL

## 2023-03-12 MED ORDER — FAMOTIDINE 20 MG PO TABS: 20.0000 mg | ORAL_TABLET | Freq: Two times a day (BID) | ORAL | 0 refills | Status: AC

## 2023-03-12 MED ORDER — ONDANSETRON 4 MG PO TBDP: 4.0000 mg | ORAL_TABLET | Freq: Four times a day (QID) | ORAL | 0 refills | Status: DC | PRN

## 2023-03-12 NOTE — ED Provider Notes (Signed)
[facility]    CSN: [id number] Arrival date & time: 03/12/23  1820     History   Chief Complaint Chief Complaint  Patient presents with   Nausea    Vomiting    HPI Jeff Banks is a 27 y.o. male.  Here with nausea and vomiting since yesterday. 3 episodes emesis last night 1 this morning. Has not tried any food or fluids Mild abdominal discomfort, rated 3/10. No diarrhea No fevers No known sick contacts, no recent travel   No medications taken   History reviewed. No pertinent past medical history.  There are no problems to display for this patient.   Past Surgical History:  Procedure Laterality Date   SHOULDER SURGERY     SHOULDER SURGERY       Home Medications    Prior to Admission medications   Medication Sig Start Date End Date Taking? Authorizing Provider  famotidine (PEPCID) 20 MG tablet Take 1 tablet (20 mg total) by mouth 2 (two) times daily. 03/12/23  Yes Rakin Lemelle, [name]  ondansetron (ZOFRAN-ODT) 4 MG disintegrating tablet Take 1 tablet (4 mg total) by mouth every 6 (six) hours as needed for nausea or vomiting. 03/12/23  Yes Elliott Lasecki, Lurena Joiner, [name]    Family History Family History  Problem Relation Age of Onset   Healthy Mother    Healthy Father     Social History Social History   Tobacco Use   Smoking status: Every Day    Types: Cigarettes   Smokeless tobacco: Never  Vaping Use   Vaping Use: Never used  Substance Use Topics   Alcohol use: No   Drug use: No     Allergies   Patient has no known allergies.   Review of Systems Review of Systems Per HPI  Physical Exam Triage Vital Signs ED Triage Vitals  Enc Vitals Group     BP 03/12/23 1914 127/82     Pulse Rate 03/12/23 1914 93     Resp 03/12/23 1914 16     Temp 03/12/23 1914 98.7 F (37.1 C)     Temp Source 03/12/23 1914 Oral     SpO2 03/12/23 1914 96 %     Weight 03/12/23 1914 138 lb (62.6 kg)     Height 03/12/23 1914 6\' 1"  (1.854 m)     Head Circumference --       Peak Flow --      Pain Score 03/12/23 1913 3     Pain Loc --      Pain Edu? --      Excl. in GC? --    No data found.  Updated Vital Signs BP 127/82 (BP Location: Left Arm)   Pulse 93   Temp 98.7 F (37.1 C) (Oral)   Resp 16   Ht 6\' 1"  (1.854 m)   Wt 138 lb (62.6 kg)   SpO2 96%   BMI 18.21 kg/m    Physical Exam Vitals and nursing note reviewed.  Constitutional:      Appearance: Normal appearance.  HENT:     Mouth/Throat:     Mouth: Mucous membranes are moist.     Pharynx: Oropharynx is clear.  Eyes:     Conjunctiva/sclera: Conjunctivae normal.  Cardiovascular:     Rate and Rhythm: Normal rate and regular rhythm.     Heart sounds: Normal heart sounds.  Pulmonary:     Effort: Pulmonary effort is normal. No respiratory distress.     Breath sounds: Normal breath sounds.  Abdominal:     General: Bowel sounds are normal.     Palpations: Abdomen is soft.     Tenderness: There is no abdominal tenderness. There is no right CVA tenderness, left CVA tenderness, guarding or rebound.     Comments: Minimal discomfort epigastric   Musculoskeletal:        General: Normal range of motion.  Skin:    General: Skin is warm and dry.  Neurological:     Mental Status: He is alert and oriented to person, place, and time.     UC Treatments / Results  Labs (all labs ordered are listed, but only abnormal results are displayed) Labs Reviewed - No data to display  EKG  Radiology No results found.  Procedures Procedures   Medications Ordered in UC Medications  ondansetron (ZOFRAN-ODT) disintegrating tablet 4 mg (4 mg Oral Given 03/12/23 1931)    Initial Impression / Assessment and Plan / UC Course  I have reviewed the triage vital signs and the nursing notes.  Pertinent labs & imaging results that were available during my care of the patient were reviewed by me and considered in my medical decision making (see chart for details).  Afebrile, well appearing.  Suspect viral GI  bug Zofran ODT given in clinic with improvement in nausea PO challenge successful Advised zofran q6 hours prn, increase fluids, bland diet as tolerated. Return if needed. Work note provided. No questions at this time.   Final Clinical Impressions(s) / UC Diagnoses   Final diagnoses:  Gastroenteritis     Discharge Instructions      The nausea medicine (zofran) can be used every 6 hours as needed. Tablet dissolves under the tongue.  You can also try pepcid once or twice a day for the abdominal discomfort   Make sure you are increasing fluid intake.  It's ok if you don't have appetite, as long as you are replacing with fluids     ED Prescriptions     Medication Sig Dispense Auth. Provider   ondansetron (ZOFRAN-ODT) 4 MG disintegrating tablet Take 1 tablet (4 mg total) by mouth every 6 (six) hours as needed for nausea or vomiting. 20 tablet Jadden Yim, [name]   famotidine (PEPCID) 20 MG tablet Take 1 tablet (20 mg total) by mouth 2 (two) times daily. 30 tablet Kal Chait, Lurena Joiner, [name]      PDMP not reviewed this encounter.   Angelo Caroll, Ray Church 03/12/23 2017

## 2023-03-12 NOTE — ED Triage Notes (Signed)
Patient here today with c/o N&V X 2 days. No known cause. Mild abd pain. No sick contacts. No recent travel.

## 2023-03-12 NOTE — Discharge Instructions (Addendum)
The nausea medicine (zofran) can be used every 6 hours as needed. Tablet dissolves under the tongue.  You can also try pepcid once or twice a day for the abdominal discomfort   Make sure you are increasing fluid intake.  It's ok if you don't have appetite, as long as you are replacing with fluids

## 2023-03-17 ENCOUNTER — Ambulatory Visit (HOSPITAL_COMMUNITY)
Admission: EM | Admit: 2023-03-17 | Discharge: 2023-03-17 | Disposition: A | Discharge status: Home / Self Care | Payer: Commercial Managed Care - HMO | Attending: Emergency Medicine | Admitting: Emergency Medicine

## 2023-03-17 ENCOUNTER — Emergency Department (HOSPITAL_COMMUNITY): Payer: Commercial Managed Care - HMO

## 2023-03-17 ENCOUNTER — Other Ambulatory Visit: Payer: Self-pay

## 2023-03-17 ENCOUNTER — Encounter (HOSPITAL_COMMUNITY): Payer: Self-pay

## 2023-03-17 ENCOUNTER — Emergency Department (HOSPITAL_COMMUNITY)
Admission: EM | Admit: 2023-03-17 | Discharge: 2023-03-17 | Disposition: A | Discharge status: Home / Self Care | Payer: Commercial Managed Care - HMO | Attending: Emergency Medicine | Admitting: Emergency Medicine

## 2023-03-17 DIAGNOSIS — R1013 Epigastric pain: Secondary | ICD-10-CM

## 2023-03-17 DIAGNOSIS — R748 Abnormal levels of other serum enzymes: Secondary | ICD-10-CM | POA: Diagnosis not present

## 2023-03-17 DIAGNOSIS — K859 Acute pancreatitis without necrosis or infection, unspecified: Secondary | ICD-10-CM | POA: Diagnosis not present

## 2023-03-17 LAB — COMPREHENSIVE METABOLIC PANEL
ALT: 17 U/L (ref 0–44)
AST: 24 U/L (ref 15–41)
Albumin: 4.1 g/dL (ref 3.5–5.0)
Alkaline Phosphatase: 67 U/L (ref 38–126)
Anion gap: 13 (ref 5–15)
BUN: 6 mg/dL (ref 6–20)
CO2: 28 mmol/L (ref 22–32)
Calcium: 9.3 mg/dL (ref 8.9–10.3)
Chloride: 93 mmol/L — ABNORMAL LOW (ref 98–111)
Creatinine, Ser: 0.87 mg/dL (ref 0.61–1.24)
GFR, Estimated: >60 mL/min (ref >60)
Glucose, Bld: 140 mg/dL — ABNORMAL HIGH (ref 70–99)
Potassium: 3.7 mmol/L (ref 3.5–5.1)
Sodium: 134 mmol/L — ABNORMAL LOW (ref 135–145)
Total Bilirubin: 1.1 mg/dL (ref 0.3–1.2)
Total Protein: 7.8 g/dL (ref 6.5–8.1)

## 2023-03-17 LAB — CBC WITH DIFFERENTIAL/PLATELET
Abs Immature Granulocytes: 0.02 10*3/uL (ref 0.00–0.07)
Basophils Absolute: 0 10*3/uL (ref 0.0–0.1)
Basophils Relative: 0 %
Eosinophils Absolute: 0.1 10*3/uL (ref 0.0–0.5)
Eosinophils Relative: 1 %
HCT: 38.5 % — ABNORMAL LOW (ref 39.0–52.0)
Hemoglobin: 13.3 g/dL (ref 13.0–17.0)
Immature Granulocytes: 0 %
Lymphocytes Relative: 11 %
Lymphs Abs: 1 10*3/uL (ref 0.7–4.0)
MCH: 30.6 pg (ref 26.0–34.0)
MCHC: 34.5 g/dL (ref 30.0–36.0)
MCV: 88.5 fL (ref 80.0–100.0)
Monocytes Absolute: 0.5 10*3/uL (ref 0.1–1.0)
Monocytes Relative: 6 %
Neutro Abs: 7 10*3/uL (ref 1.7–7.7)
Neutrophils Relative %: 82 %
Platelets: 147 10*3/uL — ABNORMAL LOW (ref 150–400)
RBC: 4.35 MIL/uL (ref 4.22–5.81)
RDW: 13.6 % (ref 11.5–15.5)
WBC: 8.6 10*3/uL (ref 4.0–10.5)
nRBC: 0 % (ref 0.0–0.2)

## 2023-03-17 LAB — LIPASE, BLOOD: Lipase: 393 U/L — ABNORMAL HIGH (ref 11–51)

## 2023-03-17 MED ORDER — ONDANSETRON 4 MG PO TBDP
ORAL_TABLET | ORAL | Status: AC
  Filled 2023-03-17: qty 1

## 2023-03-17 MED ORDER — ALUM & MAG HYDROXIDE-SIMETH 200-200-20 MG/5ML PO SUSP
30.0000 mL | Freq: Once | ORAL | Status: AC
  Administered 2023-03-17: 30 mL via ORAL

## 2023-03-17 MED ORDER — ALUM & MAG HYDROXIDE-SIMETH 200-200-20 MG/5ML PO SUSP
ORAL | Status: AC
  Filled 2023-03-17: qty 30

## 2023-03-17 MED ORDER — ONDANSETRON 4 MG PO TBDP: 4.0000 mg | ORAL_TABLET | Freq: Three times a day (TID) | ORAL | 0 refills | Status: AC | PRN

## 2023-03-17 MED ORDER — OXYCODONE-ACETAMINOPHEN 5-325 MG PO TABS: 1.0000 | ORAL_TABLET | Freq: Four times a day (QID) | ORAL | 0 refills | Status: DC | PRN

## 2023-03-17 MED ORDER — SODIUM CHLORIDE 0.9 % IV BOLUS
1000.0000 mL | Freq: Once | INTRAVENOUS | Status: AC
  Administered 2023-03-17: 1000 mL via INTRAVENOUS

## 2023-03-17 MED ORDER — ONDANSETRON 4 MG PO TBDP
4.0000 mg | ORAL_TABLET | Freq: Once | ORAL | Status: AC
  Administered 2023-03-17: 4 mg via ORAL

## 2023-03-17 MED ORDER — LIDOCAINE VISCOUS HCL 2 % MT SOLN
OROMUCOSAL | Status: AC
  Filled 2023-03-17: qty 15

## 2023-03-17 MED ORDER — MORPHINE SULFATE (PF) 4 MG/ML IV SOLN
4.0000 mg | Freq: Once | INTRAVENOUS | Status: AC
  Administered 2023-03-17: 4 mg via INTRAVENOUS
  Filled 2023-03-17: qty 1

## 2023-03-17 MED ORDER — LIDOCAINE VISCOUS HCL 2 % MT SOLN
15.0000 mL | Freq: Once | OROMUCOSAL | Status: AC
  Administered 2023-03-17: 15 mL via OROMUCOSAL

## 2023-03-17 MED ORDER — IOHEXOL 350 MG/ML SOLN
75.0000 mL | Freq: Once | INTRAVENOUS | Status: AC | PRN
  Administered 2023-03-17: 75 mL via INTRAVENOUS

## 2023-03-17 MED ORDER — HYDROMORPHONE HCL 1 MG/ML IJ SOLN
1.0000 mg | Freq: Once | INTRAMUSCULAR | Status: AC
  Administered 2023-03-17: 1 mg via INTRAVENOUS
  Filled 2023-03-17: qty 1

## 2023-03-17 MED ORDER — ONDANSETRON HCL 4 MG/2ML IJ SOLN
4.0000 mg | Freq: Once | INTRAMUSCULAR | Status: AC
  Administered 2023-03-17: 4 mg via INTRAVENOUS
  Filled 2023-03-17: qty 2

## 2023-03-17 NOTE — Discharge Instructions (Signed)
Sent to ED for higher level of care 

## 2023-03-17 NOTE — ED Provider Notes (Signed)
[facility]    CSN: [id number] Arrival date & time: 03/17/23  1601     History   Chief Complaint Chief Complaint  Patient presents with   Abdominal Pain    HPI Jeff Banks is a 27 y.o. male.  Presenting with epigastric abdominal pain since yesterday, rated 7/10. Had one episode of emesis last night. No diarrhea. Full feeling after eating, decreased appetite. He is tolerating fluids.  Took tylenol this afternoon No fevers  Was seen by this provider 5 days ago on 03/12/2023 for suspected gastroenteritis. At that time rated abd pain 3/10. Zofran given in clinic that helped symptoms and he was able to tolerate fluids before discharge. I had prescribed him zofran and pepcid but he did not pick up either medicine. He stated he felt better after the visit and didn't think he needed them. Was not having any further symptoms until yesterday   History reviewed. No pertinent past medical history.  There are no problems to display for this patient.   Past Surgical History:  Procedure Laterality Date   SHOULDER SURGERY     SHOULDER SURGERY         Home Medications    Prior to Admission medications   Medication Sig Start Date End Date Taking? Authorizing Provider  famotidine (PEPCID) 20 MG tablet Take 1 tablet (20 mg total) by mouth 2 (two) times daily. 03/12/23   Abdikadir Fohl, [name]  ondansetron (ZOFRAN-ODT) 4 MG disintegrating tablet Take 1 tablet (4 mg total) by mouth every 6 (six) hours as needed for nausea or vomiting. 03/12/23   Amylynn Fano, Lurena Joiner [name]    Family History Family History  Problem Relation Age of Onset   Healthy Mother    Healthy Father     Social History Social History   Tobacco Use   Smoking status: Every Day    Types: Cigarettes   Smokeless tobacco: Never  Vaping Use   Vaping status: Never Used  Substance Use Topics   Alcohol use: Yes    Comment: occasionally   Drug use: No     Allergies   Patient has no known allergies.   Review of  Systems Review of Systems  Gastrointestinal:  Positive for abdominal pain.   As per HPI  Physical Exam Triage Vital Signs ED Triage Vitals  Encounter Vitals Group     BP 03/17/23 1612 118/83     Systolic BP Percentile --      Diastolic BP Percentile --      Pulse Rate 03/17/23 1612 (!) 113     Resp 03/17/23 1612 14     Temp 03/17/23 1612 98.4 F (36.9 C)     Temp Source 03/17/23 1612 Oral     SpO2 03/17/23 1612 98 %     Weight --      Height --      Head Circumference --      Peak Flow --      Pain Score 03/17/23 1613 7     Pain Loc --      Pain Education --      Exclude from Growth Chart --    No data found.  Updated Vital Signs BP 118/83 (BP Location: Right Arm)   Pulse (!) 105   Temp 98.4 F (36.9 C) (Oral)   Resp 14   SpO2 98%    Physical Exam Vitals and nursing note reviewed.  Constitutional:      General: He is not in acute distress.  Appearance: Normal appearance. He is not ill-appearing.  HENT:     Mouth/Throat:     Mouth: Mucous membranes are moist.     Pharynx: Oropharynx is clear. No posterior oropharyngeal erythema.  Eyes:     General: No scleral icterus.    Conjunctiva/sclera: Conjunctivae normal.  Cardiovascular:     Rate and Rhythm: Normal rate and regular rhythm.     Heart sounds: Normal heart sounds.  Pulmonary:     Effort: Pulmonary effort is normal.     Breath sounds: Normal breath sounds.  Abdominal:     General: Bowel sounds are normal.     Palpations: Abdomen is soft.     Tenderness: There is abdominal tenderness in the epigastric area. There is no right CVA tenderness, left CVA tenderness, guarding or rebound. Negative signs include Murphy's sign and McBurney's sign.     Comments: Very tender epigastric area. No guarding but grimace with exam and appears uncomfortable   Musculoskeletal:        General: Normal range of motion.     Cervical back: Normal range of motion.  Skin:    General: Skin is warm and dry.  Neurological:      Mental Status: He is alert and oriented to person, place, and time.     UC Treatments / Results  Labs (all labs ordered are listed, but only abnormal results are displayed) Labs Reviewed - No data to display  EKG  Radiology No results found.  Procedures Procedures   Medications Ordered in UC Medications  ondansetron (ZOFRAN-ODT) disintegrating tablet 4 mg (4 mg Oral Given 03/17/23 1639)  alum & mag hydroxide-simeth (MAALOX/MYLANTA) 200-200-20 MG/5ML suspension 30 mL (30 mLs Oral Given 03/17/23 1637)  lidocaine (XYLOCAINE) 2 % viscous mouth solution 15 mL (15 mLs Mouth/Throat Given 03/17/23 1637)    Initial Impression / Assessment and Plan / UC Course  I have reviewed the triage vital signs and the nursing notes.  Pertinent labs & imaging results that were available during my care of the patient were reviewed by me and considered in my medical decision making (see chart for details).  Zofran ODT given in clinic with GI cocktail Patient without any improvement in symptoms  He is still very tender on exam, especially epigastric Grimacing and very uncomfortable. He is quiet and does not verbalize his pain but it is evident on his face. Dad is bedside and asked how patient is feeling, patient replied not good. With how tender he is on exam I have advised him to be evaluated in the ED. Requires higher level of care, advanced imaging and lab work not available in the urgent care. Sent via POV with dad  Final Clinical Impressions(s) / UC Diagnoses   Final diagnoses:  Epigastric pain     Discharge Instructions      Sent to ED for higher level of care     ED Prescriptions   None    PDMP not reviewed this encounter.   Cray Monnin, Lurena Joiner, [place] 03/17/23 1704

## 2023-03-17 NOTE — ED Triage Notes (Addendum)
Patient c/o mid abdominal pain and states he vomited x 1 . Patient was seen on 03/12/23 and was presecribed Pepcid and zofran, but never picked the prescriptions up.  Patient states he took Tylenol 650 mg approx 3 hours ago.Marland Kitchen

## 2023-03-17 NOTE — Discharge Instructions (Addendum)
You are seen in the emergency department for abdominal pain.  Based on our lab work and imaging, you appear to have acute pancreatitis.  I have sent in 2 prescriptions for you to continue to take for continued pain control as well as nausea control.  A prescription for Percocet and Zofran were sent to your pharmacy.  If you have any acute worsening of your symptoms, please return to the emergency department.  Otherwise, follow-up with primary care doctor.

## 2023-03-17 NOTE — ED Notes (Signed)
Patient is being discharged from the Urgent Care and sent to the Emergency Department via POV . Per R. Rising, PA, patient is in need of higher level of care due to abdominal pain. Patient is aware and verbalizes understanding of plan of care.  Vitals:   03/17/23 1612 03/17/23 1642  BP: 118/83   Pulse: (!) 113 (!) 105  Resp: 14   Temp: 98.4 F (36.9 C)   SpO2: 98%

## 2023-03-17 NOTE — ED Provider Notes (Signed)
[place] EMERGENCY DEPARTMENT AT [facility] Provider Note   CSN: [id number] Arrival date & time: 03/17/23  1707     History Chief Complaint  Patient presents with   Abdominal Pain    Jeff Banks is a 27 y.o. male. Patient presents to the ED with concerns of abdominal pain. Patient was seen at urgent care earlier today for similar symptoms and concern for epigastric pain not improved with GI cocktail. States pain has been present for 4 days without improvement. Also endorsing some mild constipation with last BM 3 days ago. Endorsing some nausea, but denies vomiting or diarrhea. No fevers at home. Still eating and drinking well but concerned of slow improvement in symptoms. Denies urinary symptoms at this time.   Abdominal Pain      Home Medications Prior to Admission medications   Medication Sig Start Date End Date Taking? Authorizing Provider  ondansetron (ZOFRAN-ODT) 4 MG disintegrating tablet Take 1 tablet (4 mg total) by mouth every 8 (eight) hours as needed for nausea or vomiting. 03/17/23  Yes Smitty Knudsen, [name]  oxyCODONE-acetaminophen (PERCOCET/ROXICET) 5-325 MG tablet Take 1 tablet by mouth every 6 (six) hours as needed for severe pain. 03/17/23  Yes Smitty Knudsen, [name]  famotidine (PEPCID) 20 MG tablet Take 1 tablet (20 mg total) by mouth 2 (two) times daily. 03/12/23   Rising, Rebecca, [name]  ondansetron (ZOFRAN-ODT) 4 MG disintegrating tablet Take 1 tablet (4 mg total) by mouth every 6 (six) hours as needed for nausea or vomiting. 03/12/23   Rising, Lurena Joiner, [name]      Allergies    Patient has no known allergies.    Review of Systems   Review of Systems  Gastrointestinal:  Positive for abdominal pain.  All other systems reviewed and are negative.   Physical Exam Updated Vital Signs BP (!) 130/99   Pulse 70   Temp 98.2 F (36.8 C)   Resp 18   Ht 6\' 1"  (1.854 m)   Wt 62.6 kg   SpO2 100%   BMI 18.21 kg/m  Physical Exam Vitals and nursing note  reviewed.  Constitutional:      General: He is not in acute distress.    Appearance: He is well-developed.  HENT:     Head: Normocephalic and atraumatic.  Eyes:     Conjunctiva/sclera: Conjunctivae normal.  Cardiovascular:     Rate and Rhythm: Normal rate and regular rhythm.     Heart sounds: No murmur heard. Pulmonary:     Effort: Pulmonary effort is normal. No respiratory distress.     Breath sounds: Normal breath sounds.  Abdominal:     Palpations: Abdomen is soft.     Tenderness: There is abdominal tenderness in the epigastric area and periumbilical area.  Musculoskeletal:        General: No swelling.     Cervical back: Neck supple.  Skin:    General: Skin is warm and dry.     Capillary Refill: Capillary refill takes less than 2 seconds.  Neurological:     Mental Status: He is alert.  Psychiatric:        Mood and Affect: Mood normal.     ED Results / Procedures / Treatments   Labs (all labs ordered are listed, but only abnormal results are displayed) Labs Reviewed  COMPREHENSIVE METABOLIC PANEL - Abnormal; Notable for the following components:      Result Value   Sodium 134 (*)    Chloride 93 (*)  Glucose, Bld 140 (*)    All other components within normal limits  CBC WITH DIFFERENTIAL/PLATELET - Abnormal; Notable for the following components:   HCT 38.5 (*)    Platelets 147 (*)    All other components within normal limits  LIPASE, BLOOD - Abnormal; Notable for the following components:   Lipase 393 (*)    All other components within normal limits  URINALYSIS, ROUTINE W REFLEX MICROSCOPIC    EKG None  Radiology CT ABDOMEN PELVIS W CONTRAST  Result Date: 03/17/2023 CLINICAL DATA:  Abdomen pain EXAM: CT ABDOMEN AND PELVIS WITH CONTRAST TECHNIQUE: Multidetector CT imaging of the abdomen and pelvis was performed using the standard protocol following bolus administration of intravenous contrast. RADIATION DOSE REDUCTION: This exam was performed according to the  departmental dose-optimization program which includes automated exposure control, adjustment of the mA and/or kV according to patient size and/or use of iterative reconstruction technique. CONTRAST:  75mL OMNIPAQUE IOHEXOL 350 MG/ML SOLN COMPARISON:  CT 07/06/2016 FINDINGS: Lower chest: No acute abnormality. Hepatobiliary: No focal liver abnormality is seen. No gallstones, gallbladder wall thickening, or biliary dilatation. Pancreas: Peripancreatic stranding and edema consistent with acute pancreatitis. No organized fluid collections or evidence for necrosis Spleen: Normal in size without focal abnormality. Adrenals/Urinary Tract: Adrenal glands are normal. Kidneys show no hydronephrosis. Slightly dense urine content in the bladder. Stomach/Bowel: Stomach nonenlarged. Few fluid-filled loops of small bowel without obstruction or wall thickening. Vascular/Lymphatic: No significant vascular findings are present. No enlarged abdominal or pelvic lymph nodes. Reproductive: Prostate is unremarkable. Other: No free air.  Small volume free fluid in the pelvis Musculoskeletal: No acute osseous abnormality IMPRESSION: 1. Findings consistent with acute pancreatitis. No organized fluid collections or evidence for necrosis. 2. Small volume free fluid in the pelvis. 3. Slightly hyperdense appearing fluid in the bladder which may be correlated with urinalysis as indicated Electronically Signed   By: Jasmine Pang M.D.   On: 03/17/2023 19:14    Procedures Procedures   Medications Ordered in ED Medications  sodium chloride 0.9 % bolus 1,000 mL (0 mLs Intravenous Stopped 03/17/23 1941)  ondansetron (ZOFRAN) injection 4 mg (4 mg Intravenous Given 03/17/23 1743)  morphine (PF) 4 MG/ML injection 4 mg (4 mg Intravenous Given 03/17/23 1744)  iohexol (OMNIPAQUE) 350 MG/ML injection 75 mL (75 mLs Intravenous Contrast Given 03/17/23 1854)  sodium chloride 0.9 % bolus 1,000 mL (1,000 mLs Intravenous New Bag/Given 03/17/23 1948)   HYDROmorphone (DILAUDID) injection 1 mg (1 mg Intravenous Given 03/17/23 1948)    ED Course/ Medical Decision Making/ A&P                           Medical Decision Making Amount and/or Complexity of Data Reviewed Labs: ordered. Radiology: ordered.  Risk Prescription drug management.   This patient presents to the ED for concern of abdominal pain. Differential diagnosis includes pancreatitis, appendicitis, constipation, bowel obstruction, gastroenteritis   Lab Tests:  I Ordered, and personally interpreted labs.  The pertinent results include: CBC largely unremarkable, CMP with mild electrolyte derangements with sodium and chloride low, no evidence of AKI, lipase elevated at 393   Imaging Studies ordered:  I ordered imaging studies including CT abdomen pelvis I independently visualized and interpreted imaging which showed findings consistent with acute pancreatitis I agree with the radiologist interpretation   Medicines ordered and prescription drug management:  I ordered medication including fluids, Zofran, morphine, Dilaudid for dehydration, nausea, pain Reevaluation of the patient after  these medicines showed that the patient improved I have reviewed the patients home medicines and have made adjustments as needed   Problem List / ED Course:  Patient presented to the ED with concerns of abdominal pain.  Patient reports that he has been experiencing epigastric abdominal pain for the last 4 days.  He was seen by his primary care office and believed to have gastroenteritis and was sent home with Zofran.  Reports he has been having irregular bowel movements over the last 2 days as well but likely due to poor oral intake.  Was seen in urgent care earlier today and was advised to come to the emergency department for further evaluation given lack of improvement in symptoms with GI cocktail.  On initial presentation, patient is borderline tachycardic and appears to be uncomfortable  with pain localized to the epigastric region.  Low concern for GI bleed or ulcer patient denies alcohol use, NSAID use, anticoagulant use. Lab workup initiated which was largely unremarkable but lipase is notably elevated at 393.  Prior history of acute pancreatitis.  Glucose only slightly elevated at 140.  Will evaluate further with CT imaging given or generalized pain. CT positive for acute pancreatitis.  No other acute abdominal abnormalities noted.  Patient's pain largely controlled here in the emergency department is able to tolerate clears without difficulty.  Will send patient home with a prescription for Percocet and Zofran for continued management of symptoms at home.  Discussed strict return precautions.  Final Clinical Impression(s) / ED Diagnoses Final diagnoses:  Acute pancreatitis without infection or necrosis, unspecified pancreatitis type    Rx / DC Orders ED Discharge Orders          Ordered    oxyCODONE-acetaminophen (PERCOCET/ROXICET) 5-325 MG tablet  Every 6 hours PRN        03/17/23 2156    ondansetron (ZOFRAN-ODT) 4 MG disintegrating tablet  Every 8 hours PRN        03/17/23 2156              Smitty Knudsen, [name] 03/17/23 2203    Glyn Ade, MD 03/21/23 1453

## 2023-03-17 NOTE — ED Triage Notes (Signed)
Patient came to the ED after urgent care sent him with complaints of abdominal pain for the past 4 days. Patient states that the pain is all over his abdomen. He has not been having regular bowel movements the last one was 3 days ago. Patient is alert and oriented x4 and stating the pain 7/10.

## 2023-03-17 NOTE — ED Notes (Signed)
Patient given water and crackers for PO challenge.

## 2023-03-27 ENCOUNTER — Observation Stay (HOSPITAL_COMMUNITY)
Admission: EM | Admit: 2023-03-27 | Discharge: 2023-03-28 | Discharge status: Against Medical Advice | Payer: Commercial Managed Care - HMO | Attending: Family Medicine | Admitting: Family Medicine

## 2023-03-27 ENCOUNTER — Encounter (HOSPITAL_COMMUNITY): Payer: Self-pay | Admitting: Internal Medicine

## 2023-03-27 ENCOUNTER — Other Ambulatory Visit: Payer: Self-pay

## 2023-03-27 DIAGNOSIS — Z720 Tobacco use: Secondary | ICD-10-CM | POA: Diagnosis present

## 2023-03-27 DIAGNOSIS — F17210 Nicotine dependence, cigarettes, uncomplicated: Secondary | ICD-10-CM | POA: Insufficient documentation

## 2023-03-27 DIAGNOSIS — K859 Acute pancreatitis without necrosis or infection, unspecified: Secondary | ICD-10-CM | POA: Diagnosis not present

## 2023-03-27 DIAGNOSIS — R109 Unspecified abdominal pain: Secondary | ICD-10-CM | POA: Diagnosis present

## 2023-03-27 DIAGNOSIS — R112 Nausea with vomiting, unspecified: Secondary | ICD-10-CM | POA: Insufficient documentation

## 2023-03-27 DIAGNOSIS — D649 Anemia, unspecified: Secondary | ICD-10-CM | POA: Insufficient documentation

## 2023-03-27 DIAGNOSIS — R1013 Epigastric pain: Principal | ICD-10-CM

## 2023-03-27 DIAGNOSIS — Z79899 Other long term (current) drug therapy: Secondary | ICD-10-CM | POA: Insufficient documentation

## 2023-03-27 DIAGNOSIS — R7401 Elevation of levels of liver transaminase levels: Secondary | ICD-10-CM | POA: Insufficient documentation

## 2023-03-27 DIAGNOSIS — Z789 Other specified health status: Secondary | ICD-10-CM | POA: Diagnosis present

## 2023-03-27 HISTORY — DX: Acute pancreatitis without necrosis or infection, unspecified: K85.90

## 2023-03-27 LAB — CBC
HCT: 37 % — ABNORMAL LOW (ref 39.0–52.0)
Hemoglobin: 12.6 g/dL — ABNORMAL LOW (ref 13.0–17.0)
MCH: 30.6 pg (ref 26.0–34.0)
MCHC: 34.1 g/dL (ref 30.0–36.0)
MCV: 89.8 fL (ref 80.0–100.0)
Platelets: 395 10*3/uL (ref 150–400)
RBC: 4.12 MIL/uL — ABNORMAL LOW (ref 4.22–5.81)
RDW: 15.2 % (ref 11.5–15.5)
WBC: 5.8 10*3/uL (ref 4.0–10.5)
nRBC: 0 % (ref 0.0–0.2)

## 2023-03-27 LAB — COMPREHENSIVE METABOLIC PANEL
ALT: 35 U/L (ref 0–44)
AST: 51 U/L — ABNORMAL HIGH (ref 15–41)
Albumin: 4 g/dL (ref 3.5–5.0)
Alkaline Phosphatase: 94 U/L (ref 38–126)
Anion gap: 17 — ABNORMAL HIGH (ref 5–15)
BUN: 5 mg/dL — ABNORMAL LOW (ref 6–20)
CO2: 26 mmol/L (ref 22–32)
Calcium: 9 mg/dL (ref 8.9–10.3)
Chloride: 102 mmol/L (ref 98–111)
Creatinine, Ser: 0.88 mg/dL (ref 0.61–1.24)
GFR, Estimated: >60 mL/min (ref >60)
Glucose, Bld: 140 mg/dL — ABNORMAL HIGH (ref 70–99)
Potassium: 3.5 mmol/L (ref 3.5–5.1)
Sodium: 145 mmol/L (ref 135–145)
Total Bilirubin: 0.1 mg/dL — ABNORMAL LOW (ref 0.3–1.2)
Total Protein: 7.4 g/dL (ref 6.5–8.1)

## 2023-03-27 LAB — TRIGLYCERIDES: Triglycerides: 56 mg/dL (ref <150)

## 2023-03-27 LAB — LIPASE, BLOOD: Lipase: 333 U/L — ABNORMAL HIGH (ref 11–51)

## 2023-03-27 LAB — HIV ANTIBODY (ROUTINE TESTING W REFLEX): HIV Screen 4th Generation wRfx: NONREACTIVE

## 2023-03-27 MED ORDER — LACTATED RINGERS IV BOLUS
1000.0000 mL | Freq: Once | INTRAVENOUS | Status: AC
  Administered 2023-03-27: 1000 mL via INTRAVENOUS

## 2023-03-27 MED ORDER — HYDROCODONE-ACETAMINOPHEN 5-325 MG PO TABS: 1.0000 | ORAL_TABLET | Freq: Four times a day (QID) | ORAL | Status: DC | PRN

## 2023-03-27 MED ORDER — ONDANSETRON HCL 4 MG/2ML IJ SOLN: 4.0000 mg | Freq: Four times a day (QID) | INTRAMUSCULAR | Status: DC | PRN

## 2023-03-27 MED ORDER — PANTOPRAZOLE SODIUM 40 MG IV SOLR
40.0000 mg | INTRAVENOUS | Status: DC
  Administered 2023-03-27 – 2023-03-28 (×2): 40 mg via INTRAVENOUS
  Filled 2023-03-27 (×2): qty 10

## 2023-03-27 MED ORDER — ACETAMINOPHEN 650 MG RE SUPP: 650.0000 mg | Freq: Four times a day (QID) | RECTAL | Status: DC | PRN

## 2023-03-27 MED ORDER — ONDANSETRON HCL 4 MG PO TABS: 4.0000 mg | ORAL_TABLET | Freq: Four times a day (QID) | ORAL | Status: DC | PRN

## 2023-03-27 MED ORDER — HYDROMORPHONE HCL 1 MG/ML IJ SOLN
1.0000 mg | Freq: Once | INTRAMUSCULAR | Status: AC
  Administered 2023-03-27: 1 mg via INTRAVENOUS
  Filled 2023-03-27: qty 1

## 2023-03-27 MED ORDER — ALBUTEROL SULFATE (2.5 MG/3ML) 0.083% IN NEBU: 2.5000 mg | INHALATION_SOLUTION | Freq: Four times a day (QID) | RESPIRATORY_TRACT | Status: DC | PRN

## 2023-03-27 MED ORDER — MORPHINE SULFATE (PF) 2 MG/ML IV SOLN: 2.0000 mg | INTRAVENOUS | Status: DC | PRN

## 2023-03-27 MED ORDER — MORPHINE SULFATE (PF) 2 MG/ML IV SOLN
2.0000 mg | INTRAVENOUS | Status: DC | PRN
  Administered 2023-03-27 (×2): 2 mg via INTRAVENOUS
  Filled 2023-03-27 (×2): qty 1

## 2023-03-27 MED ORDER — ONDANSETRON HCL 4 MG/2ML IJ SOLN
4.0000 mg | Freq: Once | INTRAMUSCULAR | Status: AC
  Administered 2023-03-27: 4 mg via INTRAVENOUS
  Filled 2023-03-27: qty 2

## 2023-03-27 MED ORDER — HYDRALAZINE HCL 20 MG/ML IJ SOLN: 10.0000 mg | INTRAMUSCULAR | Status: DC | PRN

## 2023-03-27 MED ORDER — SODIUM CHLORIDE 0.9% FLUSH
3.0000 mL | Freq: Two times a day (BID) | INTRAVENOUS | Status: DC
  Administered 2023-03-27 – 2023-03-28 (×3): 3 mL via INTRAVENOUS

## 2023-03-27 MED ORDER — KETOROLAC TROMETHAMINE 15 MG/ML IJ SOLN
30.0000 mg | Freq: Once | INTRAMUSCULAR | Status: AC
  Administered 2023-03-27: 30 mg via INTRAVENOUS
  Filled 2023-03-27: qty 2

## 2023-03-27 MED ORDER — OXYCODONE-ACETAMINOPHEN 5-325 MG PO TABS
1.0000 | ORAL_TABLET | ORAL | Status: DC | PRN
  Administered 2023-03-27 – 2023-03-28 (×3): 1 via ORAL
  Filled 2023-03-27 (×3): qty 1

## 2023-03-27 MED ORDER — POTASSIUM CHLORIDE 2 MEQ/ML IV SOLN
INTRAVENOUS | Status: AC
  Filled 2023-03-27 (×2): qty 1000

## 2023-03-27 MED ORDER — LACTATED RINGERS IV SOLN: INTRAVENOUS | Status: DC

## 2023-03-27 MED ORDER — ENOXAPARIN SODIUM 40 MG/0.4ML IJ SOSY
40.0000 mg | PREFILLED_SYRINGE | INTRAMUSCULAR | Status: DC
  Administered 2023-03-27: 40 mg via SUBCUTANEOUS
  Filled 2023-03-27: qty 0.4

## 2023-03-27 MED ORDER — ACETAMINOPHEN 325 MG PO TABS: 650.0000 mg | ORAL_TABLET | Freq: Four times a day (QID) | ORAL | Status: DC | PRN

## 2023-03-27 NOTE — Plan of Care (Signed)

## 2023-03-27 NOTE — ED Triage Notes (Signed)
Pt BIB [facility] for diffuse abd pain with nausea and vomiting X 3 days. States was diagnosed with pancreatitis X 2 weeks ago.

## 2023-03-27 NOTE — ED Notes (Signed)
ED TO INPATIENT HANDOFF REPORT  ED Nurse Name and Phone #: Theophilus Bones [phone]  S Name/Age/Gender Jeff Banks 27 y.o. male Room/Bed: 027C/027C  Code Status   Code Status: Full Code  Home/SNF/Other Home Patient oriented to: self, place, time, and situation Is this baseline? Yes   Triage Complete: Triage complete  Chief Complaint Pancreatitis [K85.90]  Triage Note Pt BIB [facility] for diffuse abd pain with nausea and vomiting X 3 days. States was diagnosed with pancreatitis X 2 weeks ago.    Allergies No Known Allergies  Level of Care/Admitting Diagnosis ED Disposition     ED Disposition  Admit   Condition  --   Comment  Hospital Area: [facility] [[id number]]  Level of Care: Med-Surg [16]  May place patient in observation at [facility] or [facility] if equivalent level of care is available:: No  Covid Evaluation: Asymptomatic - no recent exposure (last 10 days) testing not required  Diagnosis: Pancreatitis [387564]  Admitting Physician: Clydie Braun [[id number]]  Attending Physician: Clydie Braun [[id number]]          B Medical/Surgery History No past medical history on file. Past Surgical History:  Procedure Laterality Date   SHOULDER SURGERY     SHOULDER SURGERY       A IV Location/Drains/Wounds Patient Lines/Drains/Airways Status     Active Line/Drains/Airways     Name Placement date Placement time Site Days   Peripheral IV 03/17/23 18 G Anterior;Distal;Right;Upper Arm 03/17/23  1734  Arm  10            Intake/Output Last 24 hours No intake or output data in the 24 hours ending 03/27/23 0758  Labs/Imaging Results for orders placed or performed during the hospital encounter of 03/27/23 (from the past 48 hour(s))  CBC     Status: Abnormal   Collection Time: 03/27/23  4:22 AM  Result Value Ref Range   WBC 5.8 4.0 - 10.5 K/uL   RBC 4.12 (L) 4.22 - 5.81 MIL/uL   Hemoglobin 12.6 (L) 13.0 - 17.0 g/dL   HCT 30.1 (L) 60.1 - 09.3 %    MCV 89.8 80.0 - 100.0 fL   MCH 30.6 26.0 - 34.0 pg   MCHC 34.1 30.0 - 36.0 g/dL   RDW 23.5 57.3 - 22.0 %   Platelets 395 150 - 400 K/uL   nRBC 0.0 0.0 - 0.2 %    Comment: Performed at [facility], [address] [place], [place] [address]  Comprehensive metabolic panel     Status: Abnormal   Collection Time: 03/27/23  4:22 AM  Result Value Ref Range   Sodium 145 135 - 145 mmol/L   Potassium 3.5 3.5 - 5.1 mmol/L   Chloride 102 98 - 111 mmol/L   CO2 26 22 - 32 mmol/L   Glucose, Bld 140 (H) 70 - 99 mg/dL    Comment: Glucose reference range applies only to samples taken after fasting for at least 8 hours.   BUN 5 (L) 6 - 20 mg/dL   Creatinine, Ser 0.62 0.61 - 1.24 mg/dL   Calcium 9.0 8.9 - 37.6 mg/dL   Total Protein 7.4 6.5 - 8.1 g/dL   Albumin 4.0 3.5 - 5.0 g/dL   AST 51 (H) 15 - 41 U/L   ALT 35 0 - 44 U/L   Alkaline Phosphatase 94 38 - 126 U/L   Total Bilirubin 0.1 (L) 0.3 - 1.2 mg/dL   GFR, Estimated >28 >31 mL/min  Comment: (NOTE) Calculated using the CKD-EPI Creatinine Equation (2021)    Anion gap 17 (H) 5 - 15    Comment: Performed at [facility], [address], [place], [place] [address]  Lipase, blood     Status: Abnormal   Collection Time: 03/27/23  4:22 AM  Result Value Ref Range   Lipase 333 (H) 11 - 51 U/L    Comment: Performed at [facility], [address], [place], [place] [address]   No results found.  Pending Labs Unresulted Labs (From admission, onward)     Start     Ordered   03/28/23 0500  Comprehensive metabolic panel  Tomorrow morning,   R        03/27/23 0748   03/28/23 0500  CBC  Tomorrow morning,   R        03/27/23 0748   03/27/23 0750  Triglycerides  Once,   R        03/27/23 0749   03/27/23 0743  HIV Antibody (routine testing w rflx)  (HIV Antibody (Routine testing w reflex) panel)  Once,   R        03/27/23 0748            Vitals/Pain Today's Vitals   03/27/23 0500 03/27/23 0600 03/27/23 0645 03/27/23  0655  BP: (!) 114/90 113/88 (!) 120/92   Pulse: 73 70 76   Resp: 18 15 16    Temp:      TempSrc:      SpO2: 100% 100% 100%   Weight:      Height:      PainSc:  7   7     Isolation Precautions No active isolations  Medications Medications  enoxaparin (LOVENOX) injection 40 mg (has no administration in time range)  sodium chloride flush (NS) 0.9 % injection 3 mL (has no administration in time range)  acetaminophen (TYLENOL) tablet 650 mg (has no administration in time range)    Or  acetaminophen (TYLENOL) suppository 650 mg (has no administration in time range)  ondansetron (ZOFRAN) tablet 4 mg (has no administration in time range)    Or  ondansetron (ZOFRAN) injection 4 mg (has no administration in time range)  albuterol (PROVENTIL) (2.5 MG/3ML) 0.083% nebulizer solution 2.5 mg (has no administration in time range)  hydrALAZINE (APRESOLINE) injection 10 mg (has no administration in time range)  lactated ringers 1,000 mL with potassium chloride 20 mEq infusion (has no administration in time range)  lactated ringers infusion (has no administration in time range)  morphine (PF) 2 MG/ML injection 2 mg (has no administration in time range)  oxyCODONE-acetaminophen (PERCOCET/ROXICET) 5-325 MG per tablet 1 tablet (has no administration in time range)  lactated ringers bolus 1,000 mL (1,000 mLs Intravenous New Bag/Given 03/27/23 0431)  lactated ringers bolus 1,000 mL (1,000 mLs Intravenous New Bag/Given 03/27/23 0430)  HYDROmorphone (DILAUDID) injection 1 mg (1 mg Intravenous Given 03/27/23 0431)  ondansetron (ZOFRAN) injection 4 mg (4 mg Intravenous Given 03/27/23 0431)  HYDROmorphone (DILAUDID) injection 1 mg (1 mg Intravenous Given 03/27/23 0602)  ketorolac (TORADOL) 15 MG/ML injection 30 mg (30 mg Intravenous Given 03/27/23 0602)  HYDROmorphone (DILAUDID) injection 1 mg (1 mg Intravenous Given 03/27/23 3220)    Mobility walks     Focused Assessments GI   R Recommendations: See  Admitting Provider Note  Report given to:   Additional Notes:

## 2023-03-27 NOTE — Progress Notes (Signed)
Transition of Care [facility]) - Inpatient Brief Assessment   Patient Details  Name: Jeff Banks MRN: [id number] Date of Birth: 11-01-1995  Transition of Care [facility]) CM/SW Contact:    Janae Bridgeman, RN Phone Number: 03/27/2023, 9:59 AM   Clinical Narrative:  Patient admitted for Pancreatitis and abdominal pain.  PCP referral placed for patient to follow up with [facility] to follow up regarding hospital follow up.    Resources provided for smoking cessation, medical information regarding pancreatitis, and OP Counseling for ETOH use.   Transition of Care Asessment: Insurance and Status: (P) Insurance coverage has been reviewed Patient has primary care physician: (P) No ([facility] PCP referral provided for patient to schedule hospital follow up.) Home environment has been reviewed: (P) Yes - from home Prior level of function:: (P) Independent Prior/Current Home Services: (P) No current home services Social Determinants of Health Reivew: (P) SDOH reviewed interventions complete Readmission risk has been reviewed: (P) Yes Transition of care needs: (P) transition of care needs identified, TOC will continue to follow

## 2023-03-27 NOTE — ED Provider Notes (Signed)
  Physical Exam  BP (!) 120/92 (BP Location: Right Arm)   Pulse 76   Temp 98.4 F (36.9 C) (Oral)   Resp 16   Ht 1.854 m (6\' 1" )   Wt 62.6 kg   SpO2 100%   BMI 18.21 kg/m   Physical Exam  Procedures  Procedures  ED Course / MDM    Medical Decision Making Amount and/or Complexity of Data Reviewed Labs: ordered.  Risk Prescription drug management.  Patient handoff received from Dr. Pilar Plate 27 yo male with hos pancreatitis presents with abdominal pain with first episode 7/14.  Mild etoh use D/c'd first time. Patient fluids, pain meds antiemetics Need admission for pain control and iv fluids' Care discussed with Dr. Katrinka Blazing who will see for admission       Margarita Grizzle, MD 03/27/23 [phone]

## 2023-03-27 NOTE — ED Provider Notes (Signed)
[facility] Emergency Department Provider Note MRN:  [id number]  Arrival date & time: 03/27/23     Chief Complaint   Abdominal Pain and Vomiting   History of Present Illness   Jeff Banks is a 27 y.o. year-old male with a history of pancreatitis presenting to the ED with chief complaint of abdominal pain.  Abdominal pain and vomiting.  For the past 3 days.  Had pancreatitis 2 weeks ago, went away for a little bit but it is the same pain that came back.  No fever.  Review of Systems  A thorough review of systems was obtained and all systems are negative except as noted in the HPI and PMH.   Patient's Health History   No past medical history on file.  Past Surgical History:  Procedure Laterality Date   SHOULDER SURGERY     SHOULDER SURGERY      Family History  Problem Relation Age of Onset   Healthy Mother    Healthy Father     Social History   Socioeconomic History   Marital status: Single    Spouse name: Not on file   Number of children: Not on file   Years of education: Not on file   Highest education level: Not on file  Occupational History   Not on file  Tobacco Use   Smoking status: Every Day    Types: Cigarettes   Smokeless tobacco: Never  Vaping Use   Vaping status: Never Used  Substance and Sexual Activity   Alcohol use: Yes    Comment: occasionally   Drug use: No   Sexual activity: Not on file  Other Topics Concern   Not on file  Social History Narrative   Not on file   Social Determinants of Health   Financial Resource Strain: Not on file  Food Insecurity: Not on file  Transportation Needs: Not on file  Physical Activity: Not on file  Stress: Not on file  Social Connections: Not on file  Intimate Partner Violence: Not on file     Physical Exam   Vitals:   03/27/23 0500 03/27/23 0600  BP: (!) 114/90 113/88  Pulse: 73 70  Resp: 18 15  Temp:    SpO2: 100% 100%    CONSTITUTIONAL: Well-appearing, NAD NEURO/PSYCH:   Alert and oriented x 3, no focal deficits EYES:  eyes equal and reactive ENT/NECK:  no LAD, no JVD CARDIO: Regular rate, well-perfused, normal S1 and S2 PULM:  CTAB no wheezing or rhonchi GI/GU:  non-distended, non-tender MSK/SPINE:  No gross deformities, no edema SKIN:  no rash, atraumatic   *Additional and/or pertinent findings included in MDM below  Diagnostic and Interventional Summary    EKG Interpretation Date/Time:    Ventricular Rate:    PR Interval:    QRS Duration:    QT Interval:    QTC Calculation:   R Axis:      Text Interpretation:         Labs Reviewed  CBC - Abnormal; Notable for the following components:      Result Value   RBC 4.12 (*)    Hemoglobin 12.6 (*)    HCT 37.0 (*)    All other components within normal limits  COMPREHENSIVE METABOLIC PANEL - Abnormal; Notable for the following components:   Glucose, Bld 140 (*)    BUN 5 (*)    AST 51 (*)    Total Bilirubin 0.1 (*)    Anion gap 17 (*)  All other components within normal limits  LIPASE, BLOOD - Abnormal; Notable for the following components:   Lipase 333 (*)    All other components within normal limits    No orders to display    Medications  HYDROmorphone (DILAUDID) injection 1 mg (has no administration in time range)  lactated ringers bolus 1,000 mL (1,000 mLs Intravenous New Bag/Given 03/27/23 0431)  lactated ringers bolus 1,000 mL (1,000 mLs Intravenous New Bag/Given 03/27/23 0430)  HYDROmorphone (DILAUDID) injection 1 mg (1 mg Intravenous Given 03/27/23 0431)  ondansetron (ZOFRAN) injection 4 mg (4 mg Intravenous Given 03/27/23 0431)  HYDROmorphone (DILAUDID) injection 1 mg (1 mg Intravenous Given 03/27/23 0602)  ketorolac (TORADOL) 15 MG/ML injection 30 mg (30 mg Intravenous Given 03/27/23 0602)     Procedures  /  Critical Care Procedures  ED Course and Medical Decision Making  Initial Impression and Ddx Suspect recurrent pancreatitis.  Denies heavy alcohol use.  Mild to  moderate epigastric tenderness.  No rebound guarding or rigidity, currently without indication for repeat CT imaging.  Awaiting labs, pain control.  Past medical/surgical history that increases complexity of ED encounter: Pancreatitis  Interpretation of Diagnostics I personally reviewed the laboratory assessment and my interpretation is as follows: No significant blood count or electrolyte disturbance, elevated lipase    Patient Reassessment and Ultimate Disposition/Management     Patient with continued pain, will request hospitalist admission for pancreatitis.  Patient management required discussion with the following services or consulting groups:  Hospitalist Service  Complexity of Problems Addressed Acute illness or injury that poses threat of life of bodily function  Additional Data Reviewed and Analyzed Further history obtained from: Prior labs/imaging results  Additional Factors Impacting ED Encounter Risk Use of parenteral controlled substances and Consideration of hospitalization  Elmer Sow. Pilar Plate, MD [facility] [email or web address]  Final Clinical Impressions(s) / ED Diagnoses     ICD-10-CM   1. Epigastric pain  R10.13     2. Acute pancreatitis, unspecified complication status, unspecified pancreatitis type  K85.90       ED Discharge Orders     None        Discharge Instructions Discussed with and Provided to Patient:   Discharge Instructions   None      Sabas Sous, MD 03/27/23 [phone]

## 2023-03-27 NOTE — H&P (Signed)
History and Physical    PatientJosephine Banks YQM:[id number] DOB: [birth year] DOA: 03/27/2023 DOS: the patient was seen and examined on 03/27/2023 PCP: Pcp, No  Patient coming from: Home  Chief Complaint:  Chief Complaint  Patient presents with   Abdominal Pain   Vomiting   HPI: Jeff Banks is a 27 y.o. male with medical history significant of pancreatitis, tobacco use, and alcohol use who presents with complaints of nausea, vomiting, and abdominal pain for over 2 weeks.  He states pain is generalized, constant, and crampy feeling with radiation to his back.  Patient had been seen initially at urgent care with complaints of nausea, vomiting, and abdominal pain on 7/9.  Symptoms thought to be secondary to viral gastroenteritis for which patient was able to tolerate p.o. and  he was given prescription for Zofran.  He subsequently was seen at urgent care and referred to the emergency department on 7/14 due to persistence of symptoms.  Labs reveal elevated lipase 393.  CT scan of the abdomen pelvis revealed findings consistent with acute pancreatitis without evidence of fluid collection or necrosis.  Patient was given 1 L of normal saline IV fluids and  Dilaudid.   He was ultimately discharged home with prescription for Zofran and oxycodone for pain.  At home patient reports that he has been using the medications without improvement in symptoms.    He had 3 episodes of vomiting yesterday for which he was not even able to keep small amounts of water down.  Reported emesis was stomach contents, and denied any blood present.  He had not had  an appetite due to his symptoms.  Patient makes note that he also had used Tylenol once or twice to try and treat abdominal pain.  Denies having any significant fever, chest pain, shortness of breath, cough, dysuria, or frequent NSAID use.  His last bowel movement was approximately 3 days ago.  He does report drinking alcohol, but normally only drinks 2-3 beers twice a week  not on daily basis.  Denied any recent binge drinking onset symptoms.  He does also report smoking 4 cigarettes/day on average.  He does note that he had issues with abdominal pain approximately a year ago, but did not require hospitalization.  In the emergency department patient was noted to be afebrile with initially pulse elevated up to 104, and all other vital signs maintained.  Labs significant for lipase 333, AST 51, total bilirubin 0.1, and hemoglobin 12.6.  Patient was given 2 L of lactated Ringer's, Zofran 4 mg IV, ketorolac 30 mg IV, Dilaudid 1 mg IV.  Review of Systems: As mentioned in the history of present illness. All other systems reviewed and are negative. History reviewed. No pertinent past medical history. Past Surgical History:  Procedure Laterality Date   SHOULDER SURGERY     SHOULDER SURGERY     Social History:  reports that he has been smoking cigarettes. He has never used smokeless tobacco. He reports current alcohol use of about 4.0 standard drinks of alcohol per week. He reports that he does not use drugs.  No Known Allergies  Family History  Problem Relation Age of Onset   Healthy Mother    Healthy Father     Prior to Admission medications   Medication Sig Start Date End Date Taking? Authorizing Provider  famotidine (PEPCID) 20 MG tablet Take 1 tablet (20 mg total) by mouth 2 (two) times daily. 03/12/23   Rising, Rebecca, [name]  ondansetron (ZOFRAN-ODT) 4 MG disintegrating  tablet Take 1 tablet (4 mg total) by mouth every 6 (six) hours as needed for nausea or vomiting. 03/12/23   Rising, Lurena Joiner, [name]  ondansetron (ZOFRAN-ODT) 4 MG disintegrating tablet Take 1 tablet (4 mg total) by mouth every 8 (eight) hours as needed for nausea or vomiting. 03/17/23   Smitty Knudsen, [name]  oxyCODONE-acetaminophen (PERCOCET/ROXICET) 5-325 MG tablet Take 1 tablet by mouth every 6 (six) hours as needed for severe pain. 03/17/23   Smitty Knudsen, [name]    Physical Exam: Vitals:    03/27/23 0414 03/27/23 0500 03/27/23 0600 03/27/23 0645  BP:  (!) 114/90 113/88 (!) 120/92  Pulse:  73 70 76  Resp:  18 15 16   Temp:      TempSrc:      SpO2:  100% 100% 100%  Weight: 62.6 kg     Height: 6\' 1"  (1.854 m)       Constitutional: Young male who appears to be in discomfort and an easy Eyes: PERRL, lids and conjunctivae normal ENMT: Mucous membranes are dry. Posterior pharynx clear of any exudate or lesions. Normal dentition.  Neck: normal, supple, no masses, no thyromegaly Respiratory: clear to auscultation bilaterally, no wheezing, no crackles. Normal respiratory effort. No accessory muscle use.  Cardiovascular: Regular rate and rhythm, no murmurs / rubs / gallops. No extremity edema.  Abdomen: Generalized tenderness to palpation, no masses palpated.  Bowel sounds positive.  Musculoskeletal: no clubbing / cyanosis. No joint deformity upper and lower extremities. Good ROM, no contractures. Normal muscle tone.  Skin: no rashes, lesions, ulcers. No induration Neurologic: CN 2-12 grossly intact. S  Strength 5/5 in all 4.  Psychiatric: Normal judgment and insight. Alert and oriented x 3. Normal mood.   Data Reviewed:  Reviewed labs, imaging, and pertinent records as noted above in HPI.  Assessment and Plan:  Pancreatitis Unresolved.  Patient presents with continued generalized crampy abdominal pain with nausea and vomiting.  CT scan from 7/14 had noted uncomplicated pancreatitis and lipase at that time was noted to be 393.  On admission today lipase noted to be 333 with AST elevated at 51.  Based off labs less likely secondary to biliary and previous CT did not show any signs of gallstones.  Cause is not totally clear at this time, but possibly related with alcohol use. -Admit to a MedSurg bed -Monitor intake and output -Check triglycerides level -Lactated Ringer's at 150 mL/h with 20 mEq of KCl for 1 L, then continue lactated Ringer's without thereafter -Oxycodone/morphine  as needed for moderate to severe pain respectively  Nausea and vomiting Patient reports having persistence of nausea and vomiting for which she has not been able to keep significant amount of food or liquids down. -Aspiration precautions  -Elevate head of bed least 30 degrees -Clear liquid diet and advance as tolerated  Normocytic anemia Acute.  Hemoglobin 12.6 on admission and previously had been 13.3 on 7/14.  Patient denies any reports of bleeding. -Continue to monitor  Elevated AST Acute.  AST 51, ALT 35, and total bilirubin 0.1.   -Recheck CMP in a.m.  Alcohol use Patient only reports drinking 2-3 beers twice a week on average. -Counseled patient on alcohol use being a possible cause for pancreatitis and recommended cessation  Tobacco use Patient reports smoking 4 cigarettes/day on average. -Counseled on need of cessation of tobacco use as it is also risk factor for acute on chronic pancreatitis   GI prophylaxis: Protonix IV DVT prophylaxis: Lovenox Advance Care Planning:  Code Status: Full Code    Consults: None  Family Communication: Patient declined need to update family at this time.  Severity of Illness: The appropriate patient status for this patient is OBSERVATION. Observation status is judged to be reasonable and necessary in order to provide the required intensity of service to ensure the patient's safety. The patient's presenting symptoms, physical exam findings, and initial radiographic and laboratory data in the context of their medical condition is felt to place them at decreased risk for further clinical deterioration. Furthermore, it is anticipated that the patient will be medically stable for discharge from the hospital within 2 midnights of admission.   Author: Clydie Braun, MD 03/27/2023 7:18 AM  For on call review [email or web address].

## 2023-03-28 DIAGNOSIS — K859 Acute pancreatitis without necrosis or infection, unspecified: Secondary | ICD-10-CM | POA: Diagnosis not present

## 2023-03-28 LAB — CBC
HCT: 29.3 % — ABNORMAL LOW (ref 39.0–52.0)
MCH: 30 pg (ref 26.0–34.0)
MCHC: 33.8 g/dL (ref 30.0–36.0)
RBC: 3.3 MIL/uL — ABNORMAL LOW (ref 4.22–5.81)
RDW: 15.2 % (ref 11.5–15.5)
WBC: 4.7 10*3/uL (ref 4.0–10.5)
nRBC: 0 % (ref 0.0–0.2)

## 2023-03-28 LAB — COMPREHENSIVE METABOLIC PANEL
ALT: 30 U/L (ref 0–44)
AST: 34 U/L (ref 15–41)
Albumin: 3.2 g/dL — ABNORMAL LOW (ref 3.5–5.0)
Alkaline Phosphatase: 63 U/L (ref 38–126)
Anion gap: 10 (ref 5–15)
BUN: <5 mg/dL — ABNORMAL LOW (ref 6–20)
CO2: 26 mmol/L (ref 22–32)
Calcium: 8.8 mg/dL — ABNORMAL LOW (ref 8.9–10.3)
Creatinine, Ser: 0.74 mg/dL (ref 0.61–1.24)
GFR, Estimated: >60 mL/min (ref >60)
Potassium: 4 mmol/L (ref 3.5–5.1)
Sodium: 135 mmol/L (ref 135–145)
Total Bilirubin: 0.8 mg/dL (ref 0.3–1.2)
Total Protein: 6 g/dL — ABNORMAL LOW (ref 6.5–8.1)

## 2023-03-28 NOTE — Discharge Summary (Signed)
@LOGO @   PT LEFT AMA SUMMARY  Jeff Banks MRN - [id number] DOB - 1996/08/24  Date of Admission - 03/27/2023 Date LEFT AMA: 03/28/2023  Attending Physician:  Hughie Closs, MD  Patient's PCP:  Pcp, No  Disposition: LEFT AMA  Follow-up Appts:  Not able to be arranged or discussed as pt LEFT AMA  Diagnoses at time pt LEFT AMA: Acute alcoholic pancreatitis  Initial presentation: HPI: Jeff Banks is a 27 y.o. male with medical history significant of pancreatitis, tobacco use, and alcohol use who presents with complaints of nausea, vomiting, and abdominal pain for over 2 weeks.  He states pain is generalized, constant, and crampy feeling with radiation to his back.   Patient had been seen initially at urgent care with complaints of nausea, vomiting, and abdominal pain on 7/9.  Symptoms thought to be secondary to viral gastroenteritis for which patient was able to tolerate p.o. and  he was given prescription for Zofran.  He subsequently was seen at urgent care and referred to the emergency department on 7/14 due to persistence of symptoms.  Labs reveal elevated lipase 393.  CT scan of the abdomen pelvis revealed findings consistent with acute pancreatitis without evidence of fluid collection or necrosis.  Patient was given 1 L of normal saline IV fluids and  Dilaudid.   He was ultimately discharged home with prescription for Zofran and oxycodone for pain.  At home patient reports that he has been using the medications without improvement in symptoms.     He had 3 episodes of vomiting yesterday for which he was not even able to keep small amounts of water down.  Reported emesis was stomach contents, and denied any blood present.  He had not had  an appetite due to his symptoms.  Patient makes note that he also had used Tylenol once or twice to try and treat abdominal pain.  Denies having any significant fever, chest pain, shortness of breath, cough, dysuria, or frequent NSAID use.  His last bowel movement  was approximately 3 days ago.  He does report drinking alcohol, but normally only drinks 2-3 beers twice a week not on daily basis.  Denied any recent binge drinking onset symptoms.  He does also report smoking 4 cigarettes/day on average.  He does note that he had issues with abdominal pain approximately a year ago, but did not require hospitalization.   In the emergency department patient was noted to be afebrile with initially pulse elevated up to 104, and all other vital signs maintained.  Labs significant for lipase 333, AST 51, total bilirubin 0.1, and hemoglobin 12.6.  Patient was given 2 L of lactated Ringer's, Zofran 4 mg IV, ketorolac 30 mg IV, Dilaudid 1 mg IV.  Hospital Course: Listed below are the active problems present, and the status of the care of these problems, at the time the pt decided to LEAVE AMA: Patient was admitted to hospitalist service with acute pancreatitis.  There was no signs of necrosis or abscess.  This was his second presentation within 2 weeks.  Patient seen and examined this morning.  He said that he still had pain although slightly better.  He still had some nausea but no vomiting.  On examination, he had generalized tenderness but more epigastric, right upper quadrant and left upper quadrant.  He appeared hemodynamically stable but still was not ready for discharge.  I had a lengthy discussion with the patient that our plan and recommendation was to advance his diet to full  liquid diet and slowly to soft diet and continue pain medications and reassess tomorrow morning.  Patient reported that he cannot stay in the hospital another night since he had to pick up his daughter from his wife's house since she had to go to work.  I strongly advised him to talk to his wife and make her understand how important it is for him to be in the hospital so we can treat his pancreatitis better or make some other arrangements for her daughter.  I also offered him to give a call to his wife  to help understand the situation but he declined that.  I left the room with the agreement that he is going to try to arrange something for his daughter however 30 minutes later, I was informed by the nurse that patient has decided to leave AGAINST MEDICAL ADVICE.    Medication List    Unable to be finalized as pt LEFT AMA  Day of Discharge Wt Readings from Last 3 Encounters:  03/27/23 64.8 kg  03/17/23 62.6 kg  03/12/23 62.6 kg   Temp Readings from Last 3 Encounters:  03/28/23 98.3 F (36.8 C) (Oral)  03/17/23 98 F (36.7 C) (Oral)  03/17/23 98.4 F (36.9 C) (Oral)   BP Readings from Last 3 Encounters:  03/28/23 (!) 145/96  03/17/23 (!) 136/120  03/17/23 118/83   Pulse Readings from Last 3 Encounters:  03/28/23 (!) 52  03/17/23 80  03/17/23 (!) 105    Physical Exam: General exam: Appears calm and comfortable  Respiratory system: Banks to auscultation. Respiratory effort normal. Cardiovascular system: S1 & S2 heard, RRR. No JVD, murmurs, rubs, gallops or clicks. No pedal edema. Gastrointestinal system: Abdomen is nondistended, soft and generalized tenderness, more pronounced in periumbilical, epigastric, right upper quadrant as well as left upper quadrant area. No organomegaly or masses felt. Normal bowel sounds heard. Central nervous system: Alert and oriented. No focal neurological deficits. Extremities: Symmetric 5 x 5 power. Skin: No rashes, lesions or ulcers.  Psychiatry: Judgement and insight appear normal. Mood & affect appropriate.    10:12 AM 03/28/23  Hughie Closs, MD [facility] Office  [phone]

## 2023-04-06 ENCOUNTER — Emergency Department (HOSPITAL_COMMUNITY): Payer: Commercial Managed Care - HMO

## 2023-04-06 ENCOUNTER — Emergency Department (HOSPITAL_COMMUNITY)
Admission: EM | Admit: 2023-04-06 | Discharge: 2023-04-06 | Disposition: A | Discharge status: Home / Self Care | Payer: Commercial Managed Care - HMO | Attending: Emergency Medicine | Admitting: Emergency Medicine

## 2023-04-06 ENCOUNTER — Other Ambulatory Visit: Payer: Self-pay

## 2023-04-06 ENCOUNTER — Encounter (HOSPITAL_COMMUNITY): Payer: Self-pay

## 2023-04-06 DIAGNOSIS — Y908 Blood alcohol level of 240 mg/100 ml or more: Secondary | ICD-10-CM | POA: Diagnosis not present

## 2023-04-06 DIAGNOSIS — S0990XA Unspecified injury of head, initial encounter: Secondary | ICD-10-CM | POA: Diagnosis not present

## 2023-04-06 DIAGNOSIS — S6992XA Unspecified injury of left wrist, hand and finger(s), initial encounter: Secondary | ICD-10-CM | POA: Diagnosis present

## 2023-04-06 DIAGNOSIS — S46911A Strain of unspecified muscle, fascia and tendon at shoulder and upper arm level, right arm, initial encounter: Secondary | ICD-10-CM | POA: Insufficient documentation

## 2023-04-06 DIAGNOSIS — S66912A Strain of unspecified muscle, fascia and tendon at wrist and hand level, left hand, initial encounter: Secondary | ICD-10-CM | POA: Insufficient documentation

## 2023-04-06 DIAGNOSIS — R109 Unspecified abdominal pain: Secondary | ICD-10-CM | POA: Diagnosis not present

## 2023-04-06 DIAGNOSIS — S299XXA Unspecified injury of thorax, initial encounter: Secondary | ICD-10-CM | POA: Diagnosis not present

## 2023-04-06 DIAGNOSIS — F1010 Alcohol abuse, uncomplicated: Secondary | ICD-10-CM | POA: Diagnosis not present

## 2023-04-06 DIAGNOSIS — M25532 Pain in left wrist: Secondary | ICD-10-CM | POA: Diagnosis not present

## 2023-04-06 DIAGNOSIS — Y92410 Unspecified street and highway as the place of occurrence of the external cause: Secondary | ICD-10-CM | POA: Insufficient documentation

## 2023-04-06 LAB — COMPREHENSIVE METABOLIC PANEL
ALT: 22 U/L (ref 0–44)
AST: 24 U/L (ref 15–41)
Albumin: 3.8 g/dL (ref 3.5–5.0)
Alkaline Phosphatase: 65 U/L (ref 38–126)
Anion gap: 10 (ref 5–15)
BUN: 10 mg/dL (ref 6–20)
CO2: 25 mmol/L (ref 22–32)
Calcium: 8.6 mg/dL — ABNORMAL LOW (ref 8.9–10.3)
Chloride: 107 mmol/L (ref 98–111)
Creatinine, Ser: 0.77 mg/dL (ref 0.61–1.24)
GFR, Estimated: >60 mL/min (ref >60)
Glucose, Bld: 98 mg/dL (ref 70–99)
Potassium: 3.7 mmol/L (ref 3.5–5.1)
Sodium: 142 mmol/L (ref 135–145)
Total Bilirubin: 0.4 mg/dL (ref 0.3–1.2)
Total Protein: 7.3 g/dL (ref 6.5–8.1)

## 2023-04-06 LAB — CBC
HCT: 37.8 % — ABNORMAL LOW (ref 39.0–52.0)
Hemoglobin: 12.3 g/dL — ABNORMAL LOW (ref 13.0–17.0)
MCH: 30 pg (ref 26.0–34.0)
MCHC: 32.5 g/dL (ref 30.0–36.0)
MCV: 92.2 fL (ref 80.0–100.0)
Platelets: 241 10*3/uL (ref 150–400)
RBC: 4.1 MIL/uL — ABNORMAL LOW (ref 4.22–5.81)
RDW: 15.4 % (ref 11.5–15.5)
WBC: 3.8 10*3/uL — ABNORMAL LOW (ref 4.0–10.5)
nRBC: 0 % (ref 0.0–0.2)

## 2023-04-06 LAB — ETHANOL: Alcohol, Ethyl (B): 377 mg/dL (ref <10)

## 2023-04-06 LAB — LIPASE, BLOOD: Lipase: 59 U/L — ABNORMAL HIGH (ref 11–51)

## 2023-04-06 MED ORDER — MORPHINE SULFATE (PF) 4 MG/ML IV SOLN
4.0000 mg | Freq: Once | INTRAVENOUS | Status: AC
  Administered 2023-04-06: 4 mg via INTRAVENOUS
  Filled 2023-04-06: qty 1

## 2023-04-06 MED ORDER — ONDANSETRON HCL 4 MG/2ML IJ SOLN
4.0000 mg | Freq: Once | INTRAMUSCULAR | Status: AC
  Administered 2023-04-06: 4 mg via INTRAVENOUS
  Filled 2023-04-06: qty 2

## 2023-04-06 MED ORDER — SODIUM CHLORIDE 0.9 % IV BOLUS
1000.0000 mL | Freq: Once | INTRAVENOUS | Status: AC
  Administered 2023-04-06: 1000 mL via INTRAVENOUS

## 2023-04-06 MED ORDER — IOHEXOL 350 MG/ML SOLN
75.0000 mL | Freq: Once | INTRAVENOUS | Status: AC | PRN
  Administered 2023-04-06: 75 mL via INTRAVENOUS
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
## 2023-04-06 NOTE — ED Notes (Signed)
Pt was ambulated with IV pole and supervised by ED Tech. Pt had no complaints and had steady gait with no other problems.

## 2023-04-06 NOTE — Discharge Instructions (Addendum)
Try to stop drinking.
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
## 2024-05-24 ENCOUNTER — Emergency Department (HOSPITAL_COMMUNITY)
Admission: EM | Admit: 2024-05-24 | Discharge: 2024-05-24 | Discharge status: Against Medical Advice | Attending: Emergency Medicine | Admitting: Emergency Medicine

## 2024-05-24 ENCOUNTER — Emergency Department (HOSPITAL_COMMUNITY)

## 2024-05-24 ENCOUNTER — Encounter (HOSPITAL_COMMUNITY): Payer: Self-pay | Admitting: *Deleted

## 2024-05-24 ENCOUNTER — Other Ambulatory Visit: Payer: Self-pay

## 2024-05-24 DIAGNOSIS — R109 Unspecified abdominal pain: Secondary | ICD-10-CM | POA: Diagnosis present

## 2024-05-24 DIAGNOSIS — K852 Alcohol induced acute pancreatitis without necrosis or infection: Secondary | ICD-10-CM | POA: Diagnosis not present

## 2024-05-24 DIAGNOSIS — Z5329 Procedure and treatment not carried out because of patient's decision for other reasons: Secondary | ICD-10-CM | POA: Insufficient documentation

## 2024-05-24 DIAGNOSIS — D72829 Elevated white blood cell count, unspecified: Secondary | ICD-10-CM | POA: Diagnosis not present

## 2024-05-24 LAB — CBC
HCT: 36.5 % — ABNORMAL LOW (ref 39.0–52.0)
Hemoglobin: 11.7 g/dL — ABNORMAL LOW (ref 13.0–17.0)
MCH: 28 pg (ref 26.0–34.0)
MCHC: 32.1 g/dL (ref 30.0–36.0)
MCV: 87.3 fL (ref 80.0–100.0)
Platelets: 314 K/uL (ref 150–400)
RBC: 4.18 MIL/uL — ABNORMAL LOW (ref 4.22–5.81)
RDW: 17.9 % — ABNORMAL HIGH (ref 11.5–15.5)
WBC: 12.3 K/uL — ABNORMAL HIGH (ref 4.0–10.5)
nRBC: 0 % (ref 0.0–0.2)

## 2024-05-24 LAB — COMPREHENSIVE METABOLIC PANEL WITH GFR
ALT: 15 U/L (ref 0–44)
AST: 20 U/L (ref 15–41)
Albumin: 3.3 g/dL — ABNORMAL LOW (ref 3.5–5.0)
Alkaline Phosphatase: 133 U/L — ABNORMAL HIGH (ref 38–126)
Anion gap: 14 (ref 5–15)
BUN: <5 mg/dL — ABNORMAL LOW (ref 6–20)
CO2: 24 mmol/L (ref 22–32)
Calcium: 8.4 mg/dL — ABNORMAL LOW (ref 8.9–10.3)
Chloride: 105 mmol/L (ref 98–111)
Creatinine, Ser: 0.67 mg/dL (ref 0.61–1.24)
GFR, Estimated: >60 mL/min (ref >60)
Glucose, Bld: 136 mg/dL — ABNORMAL HIGH (ref 70–99)
Potassium: 3.5 mmol/L (ref 3.5–5.1)
Sodium: 143 mmol/L (ref 135–145)
Total Bilirubin: 0.4 mg/dL (ref 0.0–1.2)
Total Protein: 8.3 g/dL — ABNORMAL HIGH (ref 6.5–8.1)

## 2024-05-24 LAB — URINALYSIS, ROUTINE W REFLEX MICROSCOPIC
Bilirubin Urine: NEGATIVE
Glucose, UA: NEGATIVE mg/dL
Hgb urine dipstick: NEGATIVE
Ketones, ur: NEGATIVE mg/dL
Leukocytes,Ua: NEGATIVE
Nitrite: NEGATIVE
Protein, ur: NEGATIVE mg/dL
Specific Gravity, Urine: 1.013 (ref 1.005–1.030)
pH: 6 (ref 5.0–8.0)

## 2024-05-24 LAB — LIPASE, BLOOD: Lipase: 535 U/L — ABNORMAL HIGH (ref 11–51)

## 2024-05-24 MED ORDER — SODIUM CHLORIDE 0.9 % IV BOLUS
1000.0000 mL | Freq: Once | INTRAVENOUS | Status: AC
  Administered 2024-05-24: 1000 mL via INTRAVENOUS

## 2024-05-24 MED ORDER — OXYCODONE-ACETAMINOPHEN 5-325 MG PO TABS: 1.0000 | ORAL_TABLET | Freq: Four times a day (QID) | ORAL | 0 refills | Status: AC | PRN

## 2024-05-24 MED ORDER — ONDANSETRON HCL 4 MG PO TABS: 4.0000 mg | ORAL_TABLET | Freq: Four times a day (QID) | ORAL | 0 refills | Status: AC

## 2024-05-24 MED ORDER — MORPHINE SULFATE (PF) 4 MG/ML IV SOLN
4.0000 mg | Freq: Once | INTRAVENOUS | Status: AC
  Administered 2024-05-24: 4 mg via INTRAVENOUS
  Filled 2024-05-24: qty 1

## 2024-05-24 MED ORDER — IOHEXOL 350 MG/ML SOLN
75.0000 mL | Freq: Once | INTRAVENOUS | Status: AC | PRN
  Administered 2024-05-24: 75 mL via INTRAVENOUS

## 2024-05-24 MED ORDER — ONDANSETRON HCL 4 MG/2ML IJ SOLN
4.0000 mg | Freq: Once | INTRAMUSCULAR | Status: AC
  Administered 2024-05-24: 4 mg via INTRAVENOUS
  Filled 2024-05-24: qty 2

## 2024-05-24 MED ORDER — OXYCODONE-ACETAMINOPHEN 5-325 MG PO TABS: 1.0000 | ORAL_TABLET | Freq: Four times a day (QID) | ORAL | 0 refills | Status: DC | PRN

## 2024-05-24 NOTE — Discharge Instructions (Addendum)
 As we discussed, I offered you admission to the hospital but you decided to leave AGAINST MEDICAL ADVICE.  You have acute pancreatitis and if you were to leave you could rapidly deteriorate.  You have excepted these risks.  If you go home and continue to feel worse please return to the ED for further care.  Please follow-up with GI on Monday by calling Dr. Glendia Cunningham's office and making an appointment.  Please take oxycodone  5 mg every 6 hours as needed for pain.  Please take Zofran  4 mg every 6 hours as needed for nausea.  Please discontinue use of alcohol as this is further worsening or pancreatitis.  Please return to the ED for further care if you decide to do so.  Please read attached guide concerning acute pancreatitis.

## 2024-05-24 NOTE — ED Notes (Signed)
 Patient transported to CT

## 2024-05-24 NOTE — ED Notes (Signed)
 Pt has left AMA with sister, advised of risks of leaving. All paperwork has been gone over with patient and he understands that he is to return for worsening symptoms

## 2024-05-24 NOTE — ED Provider Notes (Signed)
 [facility] AT [facility] Provider Note   CSN: [id number] Arrival date & time: 05/24/24  9951     Patient presents with: Abdominal Pain   Jeff Banks is a 28 y.o. 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  Patient presents to ED for evaluation of abdominal pain.  Reports that he has pancreatitis and he believes he is having a flareup.  States that he continues to drink alcohol, last drink alcohol 6 hours ago.  He endorses nausea, vomiting and diarrhea.  Denies blood in stool.  Denies fevers.  Denies any dysuria.  Does not see PCP, GI doctor.  Recently diagnosed 6 months ago he reports.   Abdominal Pain Associated symptoms: nausea and vomiting        Prior to Admission medications   Medication Sig Start Date End Date Taking? Authorizing Provider  ondansetron  (ZOFRAN ) 4 MG tablet Take 1 tablet (4 mg total) by mouth every 6 (six) hours. 05/24/24  Yes Ruthell Lonni FALCON, [name]  oxyCODONE -acetaminophen  (PERCOCET/ROXICET) 5-325 MG tablet Take 1 tablet by mouth every 6 (six) hours as needed for severe pain (pain score 7-10). 05/24/24  Yes Ruthell Lonni FALCON, [name]  famotidine  (PEPCID ) 20 MG tablet Take 1 tablet (20 mg total) by mouth 2 (two) times daily. Patient not taking: Reported on 04/06/2023 03/12/23   Rising, [place]berry, [name]  ondansetron  (ZOFRAN -ODT) 4 MG disintegrating tablet Take 1 tablet (4 mg total) by mouth every 8 (eight) hours as needed for nausea or vomiting. Patient not taking: Reported on 03/27/2023 03/17/23   Zelaya, Oscar A, [name]    Allergies: Patient has no known allergies.    Review of Systems  Gastrointestinal:  Positive for abdominal pain, nausea and vomiting.  All other systems reviewed and are negative.   Updated Vital Signs BP 123/79   Pulse 83   Temp 97.7 F (36.5 C)   Resp 18   Ht 6' 1 (1.854 m)   Wt 65 kg   SpO2 97%   BMI 18.91 kg/m   Physical Exam Vitals and nursing note reviewed.  Constitutional:       General: He is not in acute distress.    Appearance: He is well-developed.  HENT:     Head: Normocephalic and atraumatic.  Eyes:     Conjunctiva/sclera: Conjunctivae normal.  Cardiovascular:     Rate and Rhythm: Normal rate and regular rhythm.     Heart sounds: No murmur heard. Pulmonary:     Effort: Pulmonary effort is normal. No respiratory distress.     Breath sounds: Normal breath sounds.  Abdominal:     Palpations: Abdomen is soft.     Tenderness: There is abdominal tenderness.     Comments: Epigastric TTP.  No overlying skin change.  Musculoskeletal:        General: No swelling.     Cervical back: Neck supple.  Skin:    General: Skin is warm and dry.     Capillary Refill: Capillary refill takes less than 2 seconds.  Neurological:     Mental Status: He is alert and oriented to person, place, and time. Mental status is at baseline.  Psychiatric:        Mood and Affect: Mood normal.     (all labs ordered are listed, but only abnormal results are displayed) Labs Reviewed  LIPASE, BLOOD - Abnormal; Notable for the following components:      Result Value   Lipase 535 (*)    All  other components within normal limits  COMPREHENSIVE METABOLIC PANEL WITH GFR - Abnormal; Notable for the following components:   Glucose, Bld 136 (*)    BUN <5 (*)    Calcium 8.4 (*)    Total Protein 8.3 (*)    Albumin 3.3 (*)    Alkaline Phosphatase 133 (*)    All other components within normal limits  CBC - Abnormal; Notable for the following components:   WBC 12.3 (*)    RBC 4.18 (*)    Hemoglobin 11.7 (*)    HCT 36.5 (*)    RDW 17.9 (*)    All other components within normal limits  URINALYSIS, ROUTINE W REFLEX MICROSCOPIC    EKG: None  Radiology: CT ABDOMEN PELVIS W CONTRAST Result Date: 05/24/2024 CLINICAL DATA:  Pancreatitis.  Nausea. EXAM: CT ABDOMEN AND PELVIS WITH CONTRAST TECHNIQUE: Multidetector CT imaging of the abdomen and pelvis was performed using the standard protocol  following bolus administration of intravenous contrast. RADIATION DOSE REDUCTION: This exam was performed according to the departmental dose-optimization program which includes automated exposure control, adjustment of the mA and/or kV according to patient size and/or use of iterative reconstruction technique. CONTRAST:  75mL OMNIPAQUE  IOHEXOL  350 MG/ML SOLN COMPARISON:  04/06/2023 FINDINGS: Lower chest: Patchy airspace disease in the left lung base is likely atelectatic although pneumonia is not excluded. Hepatobiliary: No suspicious focal abnormality within the liver parenchyma. There is no evidence for gallstones, gallbladder wall thickening, or pericholecystic fluid. No intrahepatic or extrahepatic biliary dilation. Pancreas: Dystrophic calcification is identified in the pancreatic parenchyma, new in the interval. 16 x 6 mm tubular calcification is identified in the body of the pancreas on image 26/3. Main pancreatic duct proximal to this in the tail the pancreas is dilated up to 5 mm. There is peripancreatic edema/inflammation mainly in the region of the body and tail extending into the splenic hilum and splenic flexure region of the colon. No rim enhancing or organized retroperitoneal fluid collection at this time. Pancreatic parenchyma enhances throughout. Spleen: Wedge-shaped subcapsular small focus of hypoperfusion in the lateral spleen suggest the presence of a small infarct. There is mild perisplenic fluid evident. Probable thickening of the posterior left hemidiaphragm with some trace subdiaphragmatic or sub pulmonic fluid along the dome of the diaphragm. Adrenals/Urinary Tract: Right adrenal gland unremarkable. Left adrenal gland appears mildly thickened and irregular. Kidneys unremarkable. No evidence for hydroureter. The urinary bladder appears normal for the degree of distention. Stomach/Bowel: Stomach is unremarkable. No gastric wall thickening. No evidence of outlet obstruction. Duodenum is  normally positioned as is the ligament of Treitz. No small bowel wall thickening. No small bowel dilatation. The terminal ileum is normal. The appendix is not well visualized, but there is no edema or inflammation in the region of the cecal tip to suggest appendicitis. No gross colonic mass. No colonic wall thickening. Vascular/Lymphatic: No abdominal aortic aneurysm. No abdominal aortic atherosclerotic calcification. Mass-effect noted on the portal splenic confluence secondary to the pancreatic inflammatory changes. 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 There is no gastrohepatic or hepatoduodenal ligament lymphadenopathy. No retroperitoneal or mesenteric lymphadenopathy. No pelvic sidewall lymphadenopathy. Reproductive: The prostate gland and seminal vesicles are unremarkable. Other: No substantial free fluid in the pelvis. Musculoskeletal: No worrisome lytic or sclerotic osseous abnormality. IMPRESSION: 1. Peripancreatic edema/inflammation extending into the splenic hilum and splenic flexure region of the colon. No rim enhancing or organized retroperitoneal fluid collection at this time. Imaging features compatible with acute pancreatitis. No evidence for pancreatic necrosis 2.  16 x 6 mm tubular calcification in the body of the pancreas with dilatation of the main pancreatic duct in the tail the pancreas. This may be a stone in the main pancreatic duct. Relatively diffuse dystrophic pancreatic calcification on today's study is new since prior exam. 3. Mass-effect on the portal splenic confluence due to the pancreatic inflammatory changes. SMV and portal vein remain patent. 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 Wedge-shaped subcapsular small focus of hypoperfusion in the lateral spleen suggest the presence of a small infarct. 4. Probable thickening of the posterior left hemidiaphragm with some trace subdiaphragmatic or sub pulmonic fluid along the dome of the diaphragm. 5. Patchy airspace disease in the left lung  base is likely atelectatic although pneumonia is not excluded. Electronically Signed   By: Camellia Candle M.D.   On: 05/24/2024 05:28    Procedures   Medications Ordered in the ED  sodium chloride  0.9 % bolus 1,000 mL (1,000 mLs Intravenous New Bag/Given 05/24/24 0424)  morphine  (PF) 4 MG/ML injection 4 mg (4 mg Intravenous Given 05/24/24 0432)  ondansetron  (ZOFRAN ) injection 4 mg (4 mg Intravenous Given 05/24/24 0432)  iohexol  (OMNIPAQUE ) 350 MG/ML injection 75 mL (75 mLs Intravenous Contrast Given 05/24/24 0453)    Medical Decision Making Amount and/or Complexity of Data Reviewed Radiology: ordered. ECG/medicine tests: ordered.  Risk Prescription drug management.   This is a 28 year old male patient who presents to the ED with concern of epigastric abdominal pain, patient has history of pancreatitis.  On exam, the patient is afebrile and nontachycardic.  His lung sounds are clear bilaterally, he is not hypoxic.  Abdomen has tenderness in the epigastric region.  Neurological examination at baseline.  Overall the patient is hemodynamically stable.  Patient initially given 4 mg of morphine , 1 L of fluid, 4 mg of Zofran .  CBC here with leukocytosis of 12.3, globin appears at baseline 11.7.  Metabolic panel without electrolyte derangement,, glucose 136, alk phos elevated to 133, anion gap 14.  Urinalysis negative for all.  Lipase 535.  CT abdomen pelvis obtained which shows peripancreatic edema/inflammation extending into the splenic hilum and splenic flexure region of the colon.  No rim-enhancing or organized retroperitoneal fluid collection at this time.  Imaging features compatible with acute pancreatitis.  No evidence for pancreatic necrosis.  Patient also has a 16 x 6 mm tubular calcification in the body of the pancreas with dilatation of the pancreatic duct.  Relatively diffuse dystrophic pancreatic calcification on today's study which is new to prior exam.  Mass effect is present on this  portal spleen confluence due to the pancreatic inflammatory changes.  SMV and portal vein remain patent.  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  Wedge-shaped subcapsular small focus of Phaedra perfusion in the lateral spleen suggest the presence of a small infarct.   At this time, I offered the patient admission to the hospital for further workup and GI consult.  The patient states that he wishes to go home.  He is alert and oriented x 4.  He has capacity to make these decisions.  I advised the patient that he could deteriorate rapidly if he were to go home and he accepts these risks.  Patient reports that he wishes to go home and does not wish to be admitted.  Patient will signed out AGAINST MEDICAL ADVICE.  Patient will be discharged home at this time with GI follow-up, pain control and nausea medication.  He was advised that if he goes home and continues  to deteriorate he will need to return to the ED for further care.  He voiced understanding.  He all of his questions answered to his satisfaction.  Patient signing out AGAINST MEDICAL ADVICE.    Final diagnoses:  Alcohol-induced acute pancreatitis without infection or necrosis  Left against medical advice    ED Discharge Orders          Ordered    ondansetron  (ZOFRAN ) 4 MG tablet  Every 6 hours        05/24/24 0547    oxyCODONE -acetaminophen  (PERCOCET/ROXICET) 5-325 MG tablet  Every 6 hours PRN        05/24/24 0547               Ruthell Lonni FALCON, [name] 05/24/24 0557    Mesner, Selinda, MD 05/24/24 [phone]

## 2024-05-24 NOTE — ED Triage Notes (Signed)
 The pt has had a diagnosis of pancreatitis for 2 months  he has this from alcohol  he has been drinking today also   nausea no vomiting
